# Patient Record
Sex: Female | Born: 1990 | Race: Black or African American | Hispanic: No | Marital: Single | Smoking: Never smoker
Health system: Southern US, Community
[De-identification: ages and names within clinical notes are randomized; demographics above are authoritative.]

## PROBLEM LIST (undated history)

## (undated) DIAGNOSIS — Z789 Other specified health status: Secondary | ICD-10-CM

## (undated) HISTORY — DX: Other specified health status: Z78.9

## (undated) HISTORY — PX: NO PAST SURGERIES: SHX2092

---

## 2019-05-11 ENCOUNTER — Other Ambulatory Visit: Payer: Self-pay

## 2019-05-11 ENCOUNTER — Encounter: Payer: Self-pay | Admitting: General Practice

## 2019-05-11 ENCOUNTER — Ambulatory Visit (INDEPENDENT_AMBULATORY_CARE_PROVIDER_SITE_OTHER): Payer: Medicaid Other | Admitting: *Deleted

## 2019-05-11 VITALS — BP 95/60 | HR 97 | Temp 97.9°F | Ht 61.0 in | Wt 89.0 lb

## 2019-05-11 DIAGNOSIS — Z3200 Encounter for pregnancy test, result unknown: Secondary | ICD-10-CM

## 2019-05-11 DIAGNOSIS — Z3201 Encounter for pregnancy test, result positive: Secondary | ICD-10-CM | POA: Diagnosis not present

## 2019-05-11 DIAGNOSIS — Z3480 Encounter for supervision of other normal pregnancy, unspecified trimester: Secondary | ICD-10-CM | POA: Insufficient documentation

## 2019-05-11 DIAGNOSIS — Z3481 Encounter for supervision of other normal pregnancy, first trimester: Secondary | ICD-10-CM

## 2019-05-11 LAB — POCT URINE PREGNANCY: Preg Test, Ur: POSITIVE — AB

## 2019-05-11 NOTE — Progress Notes (Signed)
   PRENATAL INTAKE SUMMARY  Ms. Drach presents today New OB Nurse Interview.  OB History    Gravida  5   Para  3   Term  3   Preterm      AB  1   Living  3     SAB  1   TAB      Ectopic      Multiple      Live Births  3          I have reviewed the patient's medical, obstetrical, social, and family histories, medications, and available lab results.  SUBJECTIVE She has no unusual complaints.  OBJECTIVE Initial nurse visit for history and lab work (New OB).  EDD: 11/23/2019 by LMP GA: [redacted]w[redacted]d G5P3013 [id number]  GENERAL APPEARANCE: alert, well appearing, in no apparent distress, oriented to person, place and time.   ASSESSMENT Normal pregnancy  PLAN Prenatal care-[facility] Pnl/HIV  OB Urine Culture GC/CT/PAP at next visit with Midwife HgbEval/SMA/CF ([facility])- patient declined today, will discuss at later date Panorama-declined today, will discuss at later date. Ultrasound ordered to confirm dating/viability Discussed enrollment for [employer]. Patient to purchase PNV  Clovis Pu, RN

## 2019-05-12 LAB — OBSTETRIC PANEL, INCLUDING HIV
Antibody Screen: NEGATIVE
Basophils Absolute: 0 10*3/uL (ref 0.0–0.2)
Basos: 1 %
EOS (ABSOLUTE): 0 10*3/uL (ref 0.0–0.4)
Eos: 1 %
HIV Screen 4th Generation wRfx: NONREACTIVE
Hematocrit: 41 % (ref 34.0–46.6)
Hemoglobin: 13.5 g/dL (ref 11.1–15.9)
Hepatitis B Surface Ag: NEGATIVE
Immature Grans (Abs): 0 10*3/uL (ref 0.0–0.1)
Immature Granulocytes: 0 %
Lymphocytes Absolute: 1.1 10*3/uL (ref 0.7–3.1)
Lymphs: 17 %
MCH: 26.5 pg — ABNORMAL LOW (ref 26.6–33.0)
MCHC: 32.9 g/dL (ref 31.5–35.7)
MCV: 80 fL (ref 79–97)
Monocytes Absolute: 0.4 10*3/uL (ref 0.1–0.9)
Monocytes: 5 %
Neutrophils Absolute: 5.2 10*3/uL (ref 1.4–7.0)
Neutrophils: 76 %
Platelets: 228 10*3/uL (ref 150–450)
RBC: 5.1 x10E6/uL (ref 3.77–5.28)
RDW: 15.3 % (ref 11.7–15.4)
RPR Ser Ql: NONREACTIVE
Rh Factor: POSITIVE
Rubella Antibodies, IGG: 4.3 index (ref >0.99)
WBC: 6.8 10*3/uL (ref 3.4–10.8)

## 2019-05-13 ENCOUNTER — Ambulatory Visit (HOSPITAL_COMMUNITY): Payer: Self-pay

## 2019-05-13 LAB — URINE CULTURE, OB REFLEX

## 2019-05-13 LAB — CULTURE, OB URINE

## 2019-05-26 ENCOUNTER — Other Ambulatory Visit: Payer: Self-pay

## 2019-05-26 ENCOUNTER — Other Ambulatory Visit: Payer: Self-pay | Admitting: Obstetrics and Gynecology

## 2019-05-26 ENCOUNTER — Ambulatory Visit (HOSPITAL_COMMUNITY)
Admission: RE | Admit: 2019-05-26 | Discharge: 2019-05-26 | Disposition: A | Discharge status: Home / Self Care | Payer: Medicaid Other | Source: Ambulatory Visit | Attending: Obstetrics and Gynecology | Admitting: Obstetrics and Gynecology

## 2019-05-26 DIAGNOSIS — Z3480 Encounter for supervision of other normal pregnancy, unspecified trimester: Secondary | ICD-10-CM | POA: Diagnosis present

## 2019-06-04 ENCOUNTER — Other Ambulatory Visit (HOSPITAL_COMMUNITY)
Admission: RE | Admit: 2019-06-04 | Discharge: 2019-06-04 | Disposition: A | Discharge status: Home / Self Care | Payer: Medicaid Other | Source: Ambulatory Visit | Attending: Obstetrics and Gynecology | Admitting: Obstetrics and Gynecology

## 2019-06-04 ENCOUNTER — Ambulatory Visit (INDEPENDENT_AMBULATORY_CARE_PROVIDER_SITE_OTHER): Payer: Medicaid Other | Admitting: Obstetrics and Gynecology

## 2019-06-04 ENCOUNTER — Other Ambulatory Visit: Payer: Self-pay

## 2019-06-04 VITALS — BP 102/70 | HR 101 | Temp 98.1°F | Wt 96.2 lb

## 2019-06-04 DIAGNOSIS — O98512 Other viral diseases complicating pregnancy, second trimester: Secondary | ICD-10-CM

## 2019-06-04 DIAGNOSIS — Z113 Encounter for screening for infections with a predominantly sexual mode of transmission: Secondary | ICD-10-CM | POA: Insufficient documentation

## 2019-06-04 DIAGNOSIS — Z3480 Encounter for supervision of other normal pregnancy, unspecified trimester: Secondary | ICD-10-CM | POA: Diagnosis present

## 2019-06-04 DIAGNOSIS — Z3A15 15 weeks gestation of pregnancy: Secondary | ICD-10-CM

## 2019-06-04 DIAGNOSIS — B009 Herpesviral infection, unspecified: Secondary | ICD-10-CM

## 2019-06-04 MED ORDER — VALACYCLOVIR HCL 500 MG PO TABS: 500.0000 mg | ORAL_TABLET | Freq: Two times a day (BID) | ORAL | 1 refills | Status: AC

## 2019-06-04 MED ORDER — BLOOD PRESSURE MONITOR AUTOMAT DEVI: 0 refills | Status: AC

## 2019-06-04 NOTE — Progress Notes (Signed)
  Subjective:    Alexandra Marshall is being seen today for her first obstetrical visit.  This is not a planned pregnancy. She is at [redacted]w[redacted]d gestation. 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 Last SVD was 06/19/2018. Relationship with FOB: significant other, living together. Patient does intend to breast feed. Pregnancy history fully reviewed.  Patient reports no complaints.  Review of Systems:   Review of Systems  Constitutional: Negative.   HENT: Negative.   Eyes: Negative.   Respiratory: Negative.   Cardiovascular: Negative.   Gastrointestinal: Negative.   Endocrine: Negative.   Genitourinary: Negative.   Musculoskeletal: Negative.   Skin: Negative.   Allergic/Immunologic: Negative.   Neurological: Negative.   Hematological: Negative.   Psychiatric/Behavioral: Negative.     Objective:     BP 102/70   Pulse (!) 101   Temp 98.1 F (36.7 C)   Wt 96 lb 3.2 oz (43.6 kg)   LMP 02/16/2019   BMI 18.18 kg/m  Physical Exam  Nursing note and vitals reviewed. Constitutional: She is oriented to person, place, and time. She appears well-developed and well-nourished.  HENT:  Head: Normocephalic and atraumatic.  Right Ear: External ear normal.  Left Ear: External ear normal.  Nose: Nose normal.  Mouth/Throat: Oropharynx is clear and moist.  Eyes: Pupils are equal, round, and reactive to light. Conjunctivae and EOM are normal.  Neck: Normal range of motion. Neck supple.  Cardiovascular: Normal rate, regular rhythm, normal heart sounds and intact distal pulses.  Respiratory: Effort normal and breath sounds normal.  GI: Soft. Bowel sounds are normal.  Genitourinary:    Vulva normal.     Genitourinary Comments: Uterus: gravid, S=D, SE: cervix is smooth, pink, no lesions, moderate amt of thick, white vaginal d/c -- WP, GC/CT done, closed/long/firm, no CMT or friability, no adnexal tenderness   Musculoskeletal: Normal range of motion.  Neurological: She  is alert and oriented to person, place, and time. She has normal reflexes.  Skin: Skin is warm and dry.  Psychiatric: She has a normal mood and affect. Her behavior is normal. Judgment and thought content normal.    Maternal Exam:  Abdomen: Patient reports no abdominal tenderness. Fundal height is 15 cm.    Introitus: Normal vulva. Normal vagina.  Ferning test: not done.  Nitrazine test: not done. Amniotic fluid character: not assessed.  Pelvis: adequate for delivery.   Cervix: Cervix evaluated by sterile speculum exam and digital exam.     Fetal Exam Fetal Monitor Review: Mode: hand-held doppler probe.   Baseline rate: 143 bpm.         Assessment:    Pregnancy: [id number] Patient Active Problem List   Diagnosis Date Noted  . Supervision of other normal pregnancy, antepartum 05/11/2019       Plan:     Initial labs results reviewed. Prenatal vitamins. Problem list reviewed and updated. AFP3 discussed: undecided. Role of ultrasound in pregnancy discussed; fetal survey: ordered. Amniocentesis discussed: not indicated. The nature of [facility] with multiple MDs and other Advanced Practice Providers was explained to patient; also emphasized that residents, students are part of our team.  Reviewed optimized OB schedule. BP cuff given  verbal & demonstrative instructions given  return demonstration given. Follow up in 5 weeks via My Chart video. 50% of 40 min visit spent on counseling and coordination of care.   Raelyn Mora, MSN, CNM 06/04/2019

## 2019-06-04 NOTE — Patient Instructions (Signed)

## 2019-06-05 ENCOUNTER — Encounter: Payer: Self-pay | Admitting: Obstetrics and Gynecology

## 2019-06-09 LAB — CERVICOVAGINAL ANCILLARY ONLY
Bacterial vaginitis: NEGATIVE
Candida vaginitis: NEGATIVE
Chlamydia: NEGATIVE
Neisseria Gonorrhea: NEGATIVE
Trichomonas: POSITIVE — AB

## 2019-06-14 ENCOUNTER — Telehealth: Payer: Self-pay | Admitting: General Practice

## 2019-06-14 ENCOUNTER — Telehealth: Payer: Self-pay | Admitting: *Deleted

## 2019-06-14 MED ORDER — METRONIDAZOLE 500 MG PO TABS: 2000.0000 mg | ORAL_TABLET | Freq: Once | ORAL | 1 refills | Status: AC

## 2019-06-14 NOTE — Telephone Encounter (Signed)
Attempted to call pt to inform her that her appointment that is scheduled on 07/07/2019 at 1:30pm will be an in office visit instead of [place] video per RN.  Phone not in service.  Mychart message sent to patient as well.

## 2019-06-14 NOTE — Telephone Encounter (Signed)
-----   Message from Laury Deep, [place] sent at 06/10/2019  5:02 PM EDT ----- Tx for trich

## 2019-06-14 NOTE — Telephone Encounter (Signed)
Spoke with patient regarding test results. Pt verified DOB. Patient positive for Trich. Medication sent to pharmacy. Advised patient she will need TOC in a month. Derl Barrow, RN

## 2019-06-29 ENCOUNTER — Other Ambulatory Visit: Payer: Self-pay

## 2019-06-29 ENCOUNTER — Other Ambulatory Visit (HOSPITAL_COMMUNITY): Payer: Self-pay | Admitting: *Deleted

## 2019-06-29 ENCOUNTER — Other Ambulatory Visit: Payer: Self-pay | Admitting: Obstetrics and Gynecology

## 2019-06-29 ENCOUNTER — Ambulatory Visit (HOSPITAL_COMMUNITY)
Admission: RE | Admit: 2019-06-29 | Discharge: 2019-06-29 | Disposition: A | Discharge status: Home / Self Care | Payer: Medicaid Other | Source: Ambulatory Visit | Attending: Obstetrics and Gynecology | Admitting: Obstetrics and Gynecology

## 2019-06-29 DIAGNOSIS — O350XX0 Maternal care for (suspected) central nervous system malformation in fetus, not applicable or unspecified: Secondary | ICD-10-CM

## 2019-06-29 DIAGNOSIS — Z363 Encounter for antenatal screening for malformations: Secondary | ICD-10-CM

## 2019-06-29 DIAGNOSIS — O359XX0 Maternal care for (suspected) fetal abnormality and damage, unspecified, not applicable or unspecified: Secondary | ICD-10-CM | POA: Diagnosis not present

## 2019-06-29 DIAGNOSIS — Z3A19 19 weeks gestation of pregnancy: Secondary | ICD-10-CM

## 2019-06-29 DIAGNOSIS — Z3480 Encounter for supervision of other normal pregnancy, unspecified trimester: Secondary | ICD-10-CM

## 2019-06-29 DIAGNOSIS — O3503X0 Maternal care for (suspected) central nervous system malformation or damage in fetus, choroid plexus cysts, not applicable or unspecified: Secondary | ICD-10-CM

## 2019-07-07 ENCOUNTER — Other Ambulatory Visit: Payer: Self-pay

## 2019-07-07 ENCOUNTER — Ambulatory Visit (INDEPENDENT_AMBULATORY_CARE_PROVIDER_SITE_OTHER): Payer: Medicaid Other | Admitting: Obstetrics and Gynecology

## 2019-07-07 ENCOUNTER — Other Ambulatory Visit (HOSPITAL_COMMUNITY)
Admission: RE | Admit: 2019-07-07 | Discharge: 2019-07-07 | Disposition: A | Discharge status: Home / Self Care | Payer: Medicaid Other | Source: Ambulatory Visit | Attending: Obstetrics and Gynecology | Admitting: Obstetrics and Gynecology

## 2019-07-07 ENCOUNTER — Encounter: Payer: Self-pay | Admitting: Obstetrics and Gynecology

## 2019-07-07 VITALS — BP 107/63 | HR 108 | Temp 98.3°F | Wt 101.2 lb

## 2019-07-07 DIAGNOSIS — Z113 Encounter for screening for infections with a predominantly sexual mode of transmission: Secondary | ICD-10-CM | POA: Insufficient documentation

## 2019-07-07 DIAGNOSIS — Z3480 Encounter for supervision of other normal pregnancy, unspecified trimester: Secondary | ICD-10-CM

## 2019-07-07 DIAGNOSIS — Z3A20 20 weeks gestation of pregnancy: Secondary | ICD-10-CM

## 2019-07-07 DIAGNOSIS — R3989 Other symptoms and signs involving the genitourinary system: Secondary | ICD-10-CM

## 2019-07-07 DIAGNOSIS — R8290 Unspecified abnormal findings in urine: Secondary | ICD-10-CM

## 2019-07-07 DIAGNOSIS — Z3482 Encounter for supervision of other normal pregnancy, second trimester: Secondary | ICD-10-CM

## 2019-07-07 LAB — POCT URINALYSIS DIPSTICK OB
Blood, UA: NEGATIVE
Glucose, UA: NEGATIVE
Leukocytes, UA: NEGATIVE
Nitrite, UA: NEGATIVE
Spec Grav, UA: 1.025 (ref 1.010–1.025)
Urobilinogen, UA: 1 E.U./dL
pH, UA: 6 (ref 5.0–8.0)

## 2019-07-07 NOTE — Patient Instructions (Signed)
Pregnancy and Sexually Transmitted Infections An STI (sexually transmitted infection) is a disease or infection that may be passed (transmitted) from person to person, usually during sexual activity. This may happen by way of saliva, semen, blood, vaginal mucus, or urine. An STI can be caused by bacteria, viruses, or parasites. During pregnancy, STIs can be dangerous for you and your unborn baby. It is important to take steps to reduce your chances of getting an STI. Also, you need to be seen by your health care provider right away if you think you may have an STI, or if you think you may have been exposed to an STI. Diagnosis and treatment will depend on the type of STI. If you are already pregnant, you will be screened for HIV (human immunodeficiency virus) early in your pregnancy. If you are at high risk for HIV, this test may be repeated during your third trimester of pregnancy. What are some common STIs? There are different types of STIs. Some STIs that cause problems in pregnancy include:  Gonorrhea.  Chlamydia.  Syphilis.  HIV and AIDS (acquired immunodeficiency syndrome).  Genital herpes.  Hepatitis.  Genital warts.  Human papillomavirus (HPV).  Trichomoniasis. STIs that do not affect the baby include:  Chancroid.  Pubic lice. What are the possible effects of STIs during pregnancy? STIs can cause:  Stillbirth.  Miscarriage.  Premature labor.  Premature rupture of the membranes.  Serious birth defects or deformities.  Infection of the amniotic sac.  Infections that occur after birth (postpartum) in you and the baby.  Slowed growth of the baby before birth.  Illnesses in newborns. What are common symptoms of STIs? Different STIs have different symptoms. Some women may not have any symptoms. If symptoms are present, they may include:  Painful or bloody urination.  Pain in the pelvis, abdomen, vagina, anus, throat, or eyes.  A skin rash, itching, or  irritation.  Growths, ulcerations, blisters, or sores in the genital and anal areas.  Fever.  Abnormal vaginal discharge, with or without bad odor.  Pain or bleeding during sexual intercourse.  Yellowing of the skin and the white parts of the eyes (jaundice).  Swollen glands in the groin area. Even if symptoms are not present, an STI can still be passed to another person during sexual contact. How are STIs diagnosed? Your health care provider can use tests to determine if you have an STI. These may include blood tests, urine tests, and tests performed during a pelvic exam. You should be screened for STIs, including gonorrhea and chlamydia, if:  You are sexually active and are younger than age 2.  You are age 26 or older and your health care provider tells you that you are at risk for these types of infections.  Your sexual activity has changed since the last time you were screened, and you are at an increased risk for chlamydia or gonorrhea. Ask your health care provider if you are at risk. How can I reduce my risk of getting an STI? Take these actions to reduce your risk of getting an STI:  Do not have any oral, vaginal, or anal sex. This is known as practicing abstinence.  If you have sex, use a latex condom or a female condom consistently and correctly during sexual intercourse.  Use dental dams and water-soluble lubricants during sexual activity. Do not use petroleum jelly or oils.  Avoid having multiple sexual partners.  Do not have sex with someone who has other sexual partners.  Do not  have sex with anyone you do not know or who is at high risk for an STI.  Avoid risky sex acts that can break the skin.  Do not have sex if you have open sores on your mouth or skin.  Avoid engaging in oral and anal sex acts.  Get the hepatitis vaccine. It is safe for pregnant women.  What should I do if I think I have an STI?  See your health care provider.  Tell your sexual  partner or partners. They should be tested and treated for any STIs.  Do not have sex until your health care provider says it is okay. Get help right away if: Contact your health care provider right away if:  You have any symptoms of an STI.  You think that you have, or your sexual partner has, an STI even if there are no symptoms.  You think that you may have been exposed to an STI. This information is not intended to replace advice given to you by your health care provider. Make sure you discuss any questions you have with your health care provider. Document Released: 01/23/2005 Document Revised: 04/09/2019 Document Reviewed: 07/22/2016 Elsevier Patient Education  2020 [employer]   Safe Sex Practicing safe sex means taking steps before and during sex to reduce your risk of:  Getting an STI (sexually transmitted infection).  Giving your partner an STI.  Unwanted or unplanned pregnancy. How can I practice safe sex?     Ways you can practice safe sex  Limit your sexual partners to only one partner who is having sex with only you.  Avoid using alcohol and drugs before having sex. Alcohol and drugs can affect your judgment.  Before having sex with a new partner: ? Talk to your partner about past partners, past STIs, and drug use. ? Get screened for STIs and discuss the results with your partner. Ask your partner to get screened, too.  Check your body regularly for sores, blisters, rashes, or unusual discharge. If you notice any of these problems, visit your health care provider.  Avoid sexual contact if you have symptoms of an infection or you are being treated for an STI.  While having sex, use a condom. Make sure to: ? Use a condom every time you have vaginal, oral, or anal sex. Both females and males should wear condoms during oral sex. ? Keep condoms in place from the beginning to the end of sexual activity. ? Use a latex condom, if possible. Latex condoms offer the  best protection. ? Use only water-based lubricants with a condom. Using petroleum-based lubricants or oils will weaken the condom and increase the chance that it will break. Ways your health care provider can help you practice safe sex  See your health care provider for regular screenings, exams, and tests for STIs.  Talk with your health care provider about what kind of birth control (contraception) is best for you.  Get vaccinated against hepatitis B and human papillomavirus (HPV).  If you are at risk of being infected with HIV (human immunodeficiency virus), talk with your health care provider about taking a prescription medicine to prevent HIV infection. You are at risk for HIV if you: ? Are a man who has sex with other men. ? Are sexually active with more than one partner. ? Take drugs by injection. ? Have a sex partner who has HIV. ? Have unprotected sex. ? Have sex with someone who has sex with both men and  women. ? Have had an STI. Follow these instructions at home:  Take over-the-counter and prescription medicines as told by your health care provider.  Keep all follow-up visits as told by your health care provider. This is important. Where to find more information  Centers for Disease Control and Prevention: PinkCheek.be  Planned Parenthood: https://www.plannedparenthood.org/  Office on Women's Health: BasketballVoice.it Summary  Practicing safe sex means taking steps before and during sex to reduce your risk of STIs, giving your partner STIs, and having an unwanted or unplanned pregnancy.  Before having sex with a new partner, talk to your partner about past partners, past STIs, and drug use.  Use a condom every time you have vaginal, oral, or anal sex. Both females and males should wear condoms during oral sex.  Check your body regularly for sores, blisters, rashes, or unusual  discharge. If you notice any of these problems, visit your health care provider.  See your health care provider for regular screenings, exams, and tests for STIs. This information is not intended to replace advice given to you by your health care provider. Make sure you discuss any questions you have with your health care provider. Document Released: 01/23/2005 Document Revised: 04/09/2019 Document Reviewed: 09/28/2018 Elsevier Patient Education  2020 [employer].

## 2019-07-07 NOTE — Progress Notes (Signed)
   PRENATAL VISIT NOTE  Subjective:  Alexandra Marshall is a 28 y.o. [phone] at [redacted]w[redacted]d being seen today for ongoing prenatal care.  She is currently monitored for the following issues for this low-risk pregnancy and has Supervision of other normal pregnancy, antepartum on their problem list.  Patient reports no complaints. She reports that she has "not been drinking enough water. She denies any UTI sx's. She states that she does not know if her partner took the Rx medication for trichomonas and she has had unprotected SI with him since she has been treated. Contractions: Not present. Vag. Bleeding: None.  Movement: Present. Denies leaking of fluid.   The following portions of the patient's history were reviewed and updated as appropriate: allergies, current medications, past family history, past medical history, past social history, past surgical history and problem list.   Objective:   Vitals:   07/07/19 1311  BP: 107/63  Marshall: (!) 108  Temp: 98.3 F (36.8 C)  Weight: 101 lb 3.2 oz (45.9 kg)    Fetal Status: Fetal Heart Rate (bpm): 147 Fundal Height: 19 cm Movement: Present     General:  Alert, oriented and cooperative. Patient is in no acute distress.  Skin: Skin is warm and dry. No rash noted.   Cardiovascular: Normal heart rate noted  Respiratory: Normal respiratory effort, no problems with respiration noted  Abdomen: Soft, gravid, appropriate for gestational age.  Pain/Pressure: Absent     Pelvic: Cervical exam deferred        Extremities: Normal range of motion.  Edema: None  Mental Status: Normal mood and affect. Normal behavior. Normal judgment and thought content.   Assessment and Plan:  Pregnancy: [id number] at [redacted]w[redacted]d 1. Screening examination for STD (sexually transmitted disease) - Urine cytology ancillary only([place]) - Advised to NOT have sex with partner until she is certain he has taken the medication or she will continue to re-infected further jeopardizing her  pregnancy - Explained the increased risk of PPROM, chorioamnionitis, PTL, PTB, and possibly NICU admission or fetal death.  2. Supervision of other normal pregnancy, antepartum  3. Dark yellow-colored urine - Culture, OB Urine  will tx with (+) results - POC Urinalysis Dipstick OB  4. Abnormal urine odor - Culture, OB Urine - POC Urinalysis Dipstick OB  Preterm labor symptoms and general obstetric precautions including but not limited to vaginal bleeding, contractions, leaking of fluid and fetal movement were reviewed in detail with the patient. Please refer to After Visit Summary for other counseling recommendations.   Return in about 4 weeks (around 08/04/2019) for Return OB - My Chart video.  Future Appointments  Date Time Provider [place]  08/05/2019  2:10 PM Laury Deep, CNM [facility] None  08/24/2019  1:15 PM [place] 4 WH-MFCUS M[facility]-US  09/02/2019  8:30 AM Laury Deep, CNM [facility] None    Laury Deep, CNM

## 2019-07-09 LAB — CULTURE, OB URINE

## 2019-07-09 LAB — URINE CULTURE, OB REFLEX: Organism ID, Bacteria: NO GROWTH

## 2019-07-13 LAB — URINE CYTOLOGY ANCILLARY ONLY
Chlamydia: NEGATIVE
Neisseria Gonorrhea: POSITIVE — AB
Trichomonas: NEGATIVE

## 2019-07-14 ENCOUNTER — Telehealth: Payer: Self-pay | Admitting: *Deleted

## 2019-07-14 NOTE — Telephone Encounter (Signed)
-----   Message from Laury Deep, [place] sent at 07/14/2019  9:42 AM EDT ----- Please notify and treat for (+) GC

## 2019-07-14 NOTE — Telephone Encounter (Signed)
Left voice message for patient to return nurse call regarding test results.  Edwardo Wojnarowski L, RN  

## 2019-07-15 LAB — URINE CYTOLOGY ANCILLARY ONLY
Bacterial vaginitis: NEGATIVE
Candida vaginitis: NEGATIVE

## 2019-07-15 NOTE — Telephone Encounter (Signed)
Left voice message for patient to call nurse back regarding lab results.  Derl Barrow, RN

## 2019-07-20 NOTE — Telephone Encounter (Signed)
Patient called regarding test results. Pt verified DOB. Patient aware of + gonorrhea culture and need for treatment. Appointment for Monday 07/26/2019 for treatment.  Derl Barrow, RN

## 2019-07-26 ENCOUNTER — Ambulatory Visit: Payer: Medicaid Other

## 2019-08-05 ENCOUNTER — Other Ambulatory Visit: Payer: Self-pay

## 2019-08-05 ENCOUNTER — Ambulatory Visit (INDEPENDENT_AMBULATORY_CARE_PROVIDER_SITE_OTHER): Payer: Medicaid Other | Admitting: Obstetrics and Gynecology

## 2019-08-05 VITALS — BP 103/65 | HR 86 | Temp 98.3°F | Wt 106.8 lb

## 2019-08-05 DIAGNOSIS — A6009 Herpesviral infection of other urogenital tract: Secondary | ICD-10-CM

## 2019-08-05 DIAGNOSIS — O98212 Gonorrhea complicating pregnancy, second trimester: Secondary | ICD-10-CM

## 2019-08-05 DIAGNOSIS — G930 Cerebral cysts: Secondary | ICD-10-CM

## 2019-08-05 DIAGNOSIS — Z3A24 24 weeks gestation of pregnancy: Secondary | ICD-10-CM

## 2019-08-05 DIAGNOSIS — R636 Underweight: Secondary | ICD-10-CM

## 2019-08-05 DIAGNOSIS — O98312 Other infections with a predominantly sexual mode of transmission complicating pregnancy, second trimester: Secondary | ICD-10-CM

## 2019-08-05 DIAGNOSIS — Z3480 Encounter for supervision of other normal pregnancy, unspecified trimester: Secondary | ICD-10-CM

## 2019-08-05 DIAGNOSIS — O23592 Infection of other part of genital tract in pregnancy, second trimester: Secondary | ICD-10-CM

## 2019-08-05 DIAGNOSIS — O98219 Gonorrhea complicating pregnancy, unspecified trimester: Secondary | ICD-10-CM

## 2019-08-05 DIAGNOSIS — A5901 Trichomonal vulvovaginitis: Secondary | ICD-10-CM

## 2019-08-05 DIAGNOSIS — Z7251 High risk heterosexual behavior: Secondary | ICD-10-CM | POA: Insufficient documentation

## 2019-08-05 DIAGNOSIS — O23599 Infection of other part of genital tract in pregnancy, unspecified trimester: Secondary | ICD-10-CM | POA: Insufficient documentation

## 2019-08-05 MED ORDER — AZITHROMYCIN 500 MG PO TABS
1000.0000 mg | ORAL_TABLET | Freq: Once | ORAL | Status: AC
  Administered 2019-08-05: 1000 mg via ORAL

## 2019-08-05 MED ORDER — CEFTRIAXONE SODIUM 250 MG IJ SOLR
250.0000 mg | Freq: Once | INTRAMUSCULAR | Status: AC
  Administered 2019-08-05: 250 mg via INTRAMUSCULAR

## 2019-08-05 NOTE — Progress Notes (Signed)
   PRENATAL VISIT NOTE  Subjective:  Alexandra Marshall is a 28 y.o. [phone] at [redacted]w[redacted]d being seen today for ongoing prenatal care.  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  Patient reports no complaints.  Contractions: Not present. Vag. Bleeding: None.  Movement: Present. Denies leaking of fluid.   The following portions of the patient's history were reviewed and updated as appropriate: allergies, current medications, past family history, past medical history, past social history, past surgical history and problem list.   Objective:   Vitals:   08/05/19 1352  BP: 103/65  Pulse: 86  Temp: 98.3 F (36.8 C)  Weight: 106 lb 12.8 oz (48.4 kg)    Fetal Status:   Fundal Height: 25 cm Movement: Present     General:  Alert, oriented and cooperative. Patient is in no acute distress.  Skin: Skin is warm and dry. No rash noted.   Cardiovascular: Normal heart rate noted  Respiratory: Normal respiratory effort, no problems with respiration noted  Abdomen: Soft, gravid, appropriate for gestational age.  Pain/Pressure: Absent     Pelvic: Cervical exam deferred        Extremities: Normal range of motion.  Edema: None  Mental Status: Normal mood and affect. Normal behavior. Normal judgment and thought content.   Assessment and Plan:  Pregnancy: G5P3013 at [redacted]w[redacted]d  1. Supervision of other normal pregnancy, antepartum  Doing well, missed several appointments. Discussed importance of keeping appointments   2. Gonorrhea affecting pregnancy, antepartum  - cefTRIAXone (ROCEPHIN) injection 250 mg - azithromycin (ZITHROMAX) tablet 1,000 mg  3. Gonorrhea affecting pregnancy in second trimester   4. Trichomonal vaginitis during pregnancy in second trimester  TOC negative   5. Patient underweight  Has gained 16 lbs with this pregnant.   6. High risk sexual behavior, unspecified type  Had in depth conversation about high risk sexual activity, risk for HIV etc.   Partner needs full STI workup at the [facility]. Caution advised for unprotected sex with this partner.   7. Genital herpes affecting pregnancy in second trimester  Has RX for valtrex. Start at 36 weeks   8. Choroid plexus cyst  She has a f/u US on 8/25  Preterm labor symptoms and general obstetric precautions including but not limited to vaginal bleeding, contractions, leaking of fluid and fetal movement were reviewed in detail with the patient. Please refer to After Visit Summary for other counseling recommendations.   No follow-ups on file.  Future Appointments  Date Time Provider [place]  08/24/2019  1:15 PM [place] 4 WH-[facility]US [facility]-US  09/02/2019  8:30 AM Laury Deep, CNM [facility] None    Noni Saupe, NP

## 2019-08-06 ENCOUNTER — Encounter: Payer: Self-pay | Admitting: General Practice

## 2019-08-24 ENCOUNTER — Ambulatory Visit (HOSPITAL_COMMUNITY): Payer: Medicaid Other

## 2019-08-26 ENCOUNTER — Other Ambulatory Visit: Payer: Self-pay

## 2019-08-26 ENCOUNTER — Ambulatory Visit (HOSPITAL_COMMUNITY)
Admission: RE | Admit: 2019-08-26 | Discharge: 2019-08-26 | Disposition: A | Discharge status: Home / Self Care | Payer: Medicaid Other | Source: Ambulatory Visit | Attending: Obstetrics and Gynecology | Admitting: Obstetrics and Gynecology

## 2019-08-26 ENCOUNTER — Encounter (HOSPITAL_COMMUNITY): Payer: Self-pay

## 2019-08-26 DIAGNOSIS — O350XX0 Maternal care for (suspected) central nervous system malformation in fetus, not applicable or unspecified: Secondary | ICD-10-CM | POA: Diagnosis not present

## 2019-08-26 DIAGNOSIS — O3503X0 Maternal care for (suspected) central nervous system malformation or damage in fetus, choroid plexus cysts, not applicable or unspecified: Secondary | ICD-10-CM

## 2019-08-26 DIAGNOSIS — Z3A27 27 weeks gestation of pregnancy: Secondary | ICD-10-CM | POA: Diagnosis not present

## 2019-08-26 DIAGNOSIS — Z362 Encounter for other antenatal screening follow-up: Secondary | ICD-10-CM

## 2019-08-26 DIAGNOSIS — O359XX0 Maternal care for (suspected) fetal abnormality and damage, unspecified, not applicable or unspecified: Secondary | ICD-10-CM

## 2019-09-02 ENCOUNTER — Encounter: Payer: Medicaid Other | Admitting: Obstetrics and Gynecology

## 2019-09-03 ENCOUNTER — Ambulatory Visit: Payer: Medicaid Other | Admitting: *Deleted

## 2019-09-03 ENCOUNTER — Other Ambulatory Visit: Payer: Self-pay

## 2019-09-03 DIAGNOSIS — Z3480 Encounter for supervision of other normal pregnancy, unspecified trimester: Secondary | ICD-10-CM

## 2019-09-03 NOTE — Progress Notes (Signed)
   Patient clinic for 28 week lab work.  Derl Barrow, RN

## 2019-09-04 ENCOUNTER — Other Ambulatory Visit: Payer: Self-pay | Admitting: Obstetrics and Gynecology

## 2019-09-04 DIAGNOSIS — O99013 Anemia complicating pregnancy, third trimester: Secondary | ICD-10-CM

## 2019-09-04 LAB — GLUCOSE TOLERANCE, 2 HOURS W/ 1HR
Glucose, 1 hour: 90 mg/dL (ref 65–179)
Glucose, 2 hour: 96 mg/dL (ref 65–152)
Glucose, Fasting: 69 mg/dL (ref 65–91)

## 2019-09-04 LAB — CBC
Hematocrit: 32.2 % — ABNORMAL LOW (ref 34.0–46.6)
Hemoglobin: 9.8 g/dL — ABNORMAL LOW (ref 11.1–15.9)
MCH: 22.4 pg — ABNORMAL LOW (ref 26.6–33.0)
MCHC: 30.4 g/dL — ABNORMAL LOW (ref 31.5–35.7)
MCV: 74 fL — ABNORMAL LOW (ref 79–97)
Platelets: 252 10*3/uL (ref 150–450)
RBC: 4.37 x10E6/uL (ref 3.77–5.28)
RDW: 13.9 % (ref 11.7–15.4)
WBC: 8.9 10*3/uL (ref 3.4–10.8)

## 2019-09-04 LAB — HIV ANTIBODY (ROUTINE TESTING W REFLEX): HIV Screen 4th Generation wRfx: NONREACTIVE

## 2019-09-04 LAB — RPR: RPR Ser Ql: NONREACTIVE

## 2019-09-04 MED ORDER — FERROUS SULFATE 325 (65 FE) MG PO TBEC: 325.0000 mg | DELAYED_RELEASE_TABLET | Freq: Two times a day (BID) | ORAL | 2 refills | Status: DC

## 2019-09-09 ENCOUNTER — Encounter: Payer: Medicaid Other | Admitting: Obstetrics and Gynecology

## 2019-09-16 ENCOUNTER — Other Ambulatory Visit (HOSPITAL_COMMUNITY)
Admission: RE | Admit: 2019-09-16 | Discharge: 2019-09-16 | Disposition: A | Discharge status: Home / Self Care | Payer: Medicaid Other | Source: Ambulatory Visit | Attending: Obstetrics and Gynecology | Admitting: Obstetrics and Gynecology

## 2019-09-16 ENCOUNTER — Ambulatory Visit (INDEPENDENT_AMBULATORY_CARE_PROVIDER_SITE_OTHER): Payer: Medicaid Other | Admitting: Obstetrics and Gynecology

## 2019-09-16 ENCOUNTER — Other Ambulatory Visit: Payer: Self-pay

## 2019-09-16 ENCOUNTER — Other Ambulatory Visit: Payer: Self-pay | Admitting: Obstetrics and Gynecology

## 2019-09-16 VITALS — BP 108/67 | HR 89 | Temp 98.1°F | Wt 113.0 lb

## 2019-09-16 DIAGNOSIS — Z3480 Encounter for supervision of other normal pregnancy, unspecified trimester: Secondary | ICD-10-CM

## 2019-09-16 DIAGNOSIS — Z113 Encounter for screening for infections with a predominantly sexual mode of transmission: Secondary | ICD-10-CM | POA: Diagnosis present

## 2019-09-16 DIAGNOSIS — O99013 Anemia complicating pregnancy, third trimester: Secondary | ICD-10-CM

## 2019-09-16 DIAGNOSIS — Z3A30 30 weeks gestation of pregnancy: Secondary | ICD-10-CM

## 2019-09-16 MED ORDER — FERROUS SULFATE 325 (65 FE) MG PO TBEC: 325.0000 mg | DELAYED_RELEASE_TABLET | Freq: Two times a day (BID) | ORAL | 2 refills | Status: AC

## 2019-09-16 NOTE — Progress Notes (Addendum)
   PRENATAL VISIT NOTE  Subjective:  Alexandra Marshall is a 28 y.o. [phone] at [redacted]w[redacted]d being seen today for ongoing prenatal care.  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  Patient reports her partner got treated for trichomonas, but not gonorrhea. She reports having SI with him "a couple of times, but not everyday".  Contractions: Not present. Vag. Bleeding: None.  Movement: Present. Denies leaking of fluid.   The following portions of the patient's history were reviewed and updated as appropriate: allergies, current medications, past family history, past medical history, past social history, past surgical history and problem list.   Objective:   Vitals:   09/16/19 1451  BP: 108/67  Pulse: 89  Temp: 98.1 F (36.7 C)  Weight: 113 lb (51.3 kg)    Fetal Status: Fetal Heart Rate (bpm): 146 Fundal Height: 28 cm Movement: Present  Presentation: Undeterminable  General:  Alert, oriented and cooperative. Patient is in no acute distress.  Skin: Skin is warm and dry. No rash noted.   Cardiovascular: Normal heart rate noted  Respiratory: Normal respiratory effort, no problems with respiration noted  Abdomen: Soft, gravid, appropriate for gestational age.  Pain/Pressure: Absent     Pelvic: Cervical exam performed Dilation: Closed Effacement (%): Thick Station: Ballotable  Extremities: Normal range of motion.  Edema: None  Mental Status: Normal mood and affect. Normal behavior. Normal judgment and thought content.   Assessment and Plan:  Pregnancy: [id number] at [redacted]w[redacted]d 1. Supervision of other normal pregnancy, antepartum - Anticipatory guidance for next visit to be via [facility] and GBS with  cervical exam @ 36 wks - Plan to discuss labor plans at nv - Declined TdaP  2. Screening examination for STD (sexually transmitted disease) - Stressed the importance of abstinence, if partner is not going to be treated - Discussed that STIs can cause PTl  - TOC today  will have patient come in for tx and write Rx for partner, if (+) results - Cervicovaginal ancillary only( [place]) - Information provided on GT in pregnancy and expedited partner tx   3. Anemia affecting pregnancy in third trimester - Resend Rx for ferrous sulfate 325 (65 FE) MG EC tablet; Take 1 tablet (325 mg total) by mouth 2 (two) times daily with a meal.  Dispense: 60 tablet; Refill: 2  Preterm labor symptoms and general obstetric precautions including but not limited to vaginal bleeding, contractions, leaking of fluid and fetal movement were reviewed in detail with the patient. Please refer to After Visit Summary for other counseling recommendations.   Return in about 2 weeks (around 09/30/2019) for Return OB - My Chart video.  Future Appointments  Date Time Provider [place]  09/30/2019  9:50 AM Laury Deep, CNM [facility] None  10/28/2019  1:30 PM Laury Deep, [place] None    Laury Deep, CNM

## 2019-09-16 NOTE — Patient Instructions (Addendum)
Gonorrhea Gonorrhea is a sexually transmitted disease (STD) that can affect both men and women. If left untreated, this infection can:  Damage the female or female organs.  Cause women and men to be unable to have children (be sterile).  Harm a fetus if an infected woman is pregnant. It is important to get treatment for gonorrhea as soon as possible. It is also necessary for all of your sexual partners to be tested for the infection. What are the causes? This condition is caused by bacteria called Neisseria gonorrhoeae. The infection is spread from person to person through sexual contact, including oral, anal, and vaginal sex. A newborn can contract the infection from his or her mother during birth. What increases the risk? The following factors may make you more likely to develop this condition:  Being a woman who is younger than 26 years of age and who is sexually active.  Being a woman 42 years of age or older who has: ? A new sex partner. ? More than one sex partner. ? A sex partner who has an STD.  Being a man who has: ? A new sex partner. ? More than one sex partner. ? A sex partner who has an STD.  Using condoms inconsistently.  Currently having, or having previously had, an STD.  Exchanging sex for money or drugs. What are the signs or symptoms? Some people do not have any symptoms. If you do have symptoms, they may be different for females and males. For females  Pain in the lower abdomen.  Abnormal vaginal discharge. The discharge may be cloudy, thick, or yellow-green in color.  Bleeding between periods.  Painful sex.  Burning or itching in and around the vagina.  Pain or burning when urinating.  Irritation, pain, bleeding, or discharge from the rectum. This may occur if the infection was spread by anal sex.  Sore throat or swollen lymph nodes in the neck. This may occur if the infection was spread by oral sex. For males  Abnormal discharge from the penis.  This discharge may be cloudy, thick, or yellow-green in color.  Pain or burning during urination.  Pain or swelling in the testicles.  Irritation, pain, bleeding, or discharge from the rectum. This may occur if the infection was spread by anal sex.  Sore throat, fever, or swollen lymph nodes in the neck. This may occur if the infection was spread by oral sex. How is this diagnosed? This condition is diagnosed based on:  A physical exam.  A sample of discharge that is examined under a microscope to look for the bacteria. The discharge may be taken from the urethra, cervix, throat, or rectum.  Urine tests. Not all of test results will be available during your visit. How is this treated? This condition is treated with antibiotic medicines. It is important for treatment to begin as soon as possible. Early treatment may prevent some problems from developing. Do not have sex during treatment. Avoid all types of sexual activity for 7 days after treatment is complete and until any sex partners have been treated. Follow these instructions at home:  Take over-the-counter and prescription medicines only as told by your health care provider.  Take your antibiotic medicine as told by your health care provider. Do not stop taking the antibiotic even if you start to feel better.  Do not have sex until at least 7 days after you and your partner(s) have finished treatment and your health care provider says it is okay.  It is your responsibility to get your test results. Ask your health care provider, or the department performing the test, when your results will be ready.  If you test positive for gonorrhea, inform your recent sexual partners. This includes any oral, anal, or vaginal sex partners. They need to be checked for gonorrhea even if they do not have symptoms. They may need treatment, even if they test negative for gonorrhea.  Keep all follow-up visits as told by your health care provider.  This is important. How is this prevented?   Use latex condoms correctly every time you have sexual intercourse.  Ask if your sexual partner has been tested for STDs and had negative results.  Avoid having multiple sexual partners. Contact a health care provider if:  You develop a bad reaction to the medicine you were prescribed. This may include: ? A rash. ? Nausea. ? Vomiting. ? Diarrhea.  Your symptoms do not get better after a few days of taking antibiotics.  Your symptoms get worse.  You develop new symptoms.  Your pain gets worse.  You have a fever.  You develop pain, itching, or discharge around the eyes. Get help right away if:  You feel dizzy or faint.  You have trouble breathing or have shortness of breath.  You develop an irregular heartbeat.  You have severe abdominal pain with or without shoulder pain.  You develop any bumps or sores (lesions) on your skin.  You develop warmth, redness, pain, or swelling around your joints, such as the knee. Summary  Gonorrhea is an STD that can affect both men and women.  This condition is caused by bacteria called Neisseria gonorrhoeae. The infection is spread from person to person, usually through sexual contact, including oral, anal, and vaginal sex.  Symptoms vary between males and females. Generally, they include abnormal discharge and burning during urination. Women may also experience painful sex, itching around the vagina, and bleeding between menstrual periods. Men may also experience swelling of the testicles.  This condition is treated with antibiotic medicines. Do not have sex until at least 7 days after completing antibiotic treatment.  If left untreated, gonorrhea can have serious side effects and complications. This information is not intended to replace advice given to you by your health care provider. Make sure you discuss any questions you have with your health care provider. Document Released:  12/13/2000 Document Revised: 01/22/2019 Document Reviewed: 11/15/2016 Elsevier Patient Education  2020 [employer].   Expedited Partner Therapy:  Information Sheet for Patients and Partners               You have been offered expedited partner therapy (EPT). This information sheet contains important information and warnings you need to be aware of, so please read it carefully.   Expedited Partner Therapy (EPT) is the clinical practice of treating the sexual partners of persons who receive chlamydia, gonorrhea, or trichomoniasis diagnoses by providing medications or prescriptions to the patient. Patients then provide partners with these therapies without the health-care provider having examined the partner. In other words, EPT is a convenient, fast and private way for patients to help their sexual partners get treated.   Chlamydia and gonorrhea are bacterial infections you get from having sex with a person who is already infected. Trichomoniasis (or trich) is a very common sexually transmitted infection (STI) that is caused by infection with a protozoan parasite called Trichomonas vaginalis.  Many people with these infections dont know it because they feel fine, but without  treatment these infections can cause serious health problems, such as pelvic inflammatory disease, ectopic pregnancy, infertility and increased risk of HIV.   It is important to get treated as soon as possible to protect your health, to avoid spreading these infections to others, and to prevent yourself from becoming re-infected. The good news is these infections can be easily cured with proper antibiotic medicine. The best way to take care of your self is to see a doctor or go to your local health department. If you are not able to see a doctor or other medical provider, you should take EPT.    Recommended Medication: EPT for Chlamydia:  Azithromycin (Zithromax) 1 gram orally in a single dose EPT for Gonorrhea:  Cefixime  (Suprax) 400 milligrams orally in a single dose PLUS azithromycin (Zithromax) 1 gram orally in a single dose EPT for Trichomoniasis:  Metronidazole (Flagyl) 2 grams orally in a single dose   These medicines are very safe. However, you should not take them if you have ever had an allergic reaction (like a rash) to any of these medicines: azithromycin (Zithromax), erythromycin, clarithromycin (Biaxin), metronidazole (Flagyl), tinidazole (Tindimax). If you are uncertain about whether you have an allergy, call your medical provider or pharmacist before taking this medicine. If you have a serious, long-term illness like kidney, liver or heart disease, colitis or stomach problems, or you are currently taking other prescription medication, talk to your provider before taking this medication.   Women: If you have lower belly pain, pain during sex, vomiting, or a fever, do not take this medicine. Instead, you should see a medical provider to be certain you do not have pelvic inflammatory disease (PID). PID can be serious and lead to infertility, pregnancy problems or chronic pelvic pain.   Pregnant Women: It is very important for you to see a doctor to get pregnancy services and pre-natal care. These antibiotics for EPT are safe for pregnant women, but you still need to see a medical provider as soon as possible. It is also important to note that Doxycycline is an alternative therapy for chlamydia, but it should not be taken by someone who is pregnant.   Men: If you have pain or swelling in the testicles or a fever, do not take this medicine and see a medical provider.     Men who have sex with men (MSM): MSM in [place] continue to experience high rates of syphilis and HIV. Many MSM with gonorrhea or chlamydia could also have syphilis and/or HIV and not know it. If you are a man who has sex with other men, it is very important that you see a medical provider and are tested for HIV and syphilis. EPT is not  recommended for gonorrhea for MSM.  Recommended treatment for gonorrhea for MSM is Rocephin (shot) AND azithromycin due to decreased cure rate.  Please see your medical provider if this is the case.    Along with this information sheet is a prescription for the medicine. If you receive a prescription it will be in your name and will indicate your date of birth, or it will be in the name of Expedited Partner Therapy.   In either case, you can have the prescription filled at a pharmacy. You will be responsible for the cost of the medicine, unless you have prescription drug coverage. In that case, you could provide your name so the pharmacy could bill your health plan.   Take the medication as directed. Some people will have  a mild, upset stomach, which does not last long. AVOID alcohol 24 hours after taking metronidazole (Flagyl) to reduce the possibility of a disulfiram-like reaction (severe vomiting and abdominal pain).  After taking the medicine, do not have sex for 7 days. Do not share this medicine or give it to anyone else. It is important to tell everyone you have had sex with in the last 60 days that they need to go and get tested for sexually transmitted infections.   Ways to prevent these and other sexually transmitted infections (STIs):    Abstain from sex. This is the only sure way to avoid getting an STI.   Use barrier methods, such as condoms, consistently and correctly.   Limit the number of sexual partners.   Have regular physical exams, including testing for STIs.   For more information about EPT or other issues pertaining to an STI, please contact your medical provider or the [facility] at [phone] or [email or web address]

## 2019-09-20 ENCOUNTER — Telehealth: Payer: Self-pay | Admitting: *Deleted

## 2019-09-20 DIAGNOSIS — B9689 Other specified bacterial agents as the cause of diseases classified elsewhere: Secondary | ICD-10-CM

## 2019-09-20 DIAGNOSIS — B3731 Acute candidiasis of vulva and vagina: Secondary | ICD-10-CM

## 2019-09-20 DIAGNOSIS — B373 Candidiasis of vulva and vagina: Secondary | ICD-10-CM

## 2019-09-20 LAB — CERVICOVAGINAL ANCILLARY ONLY
Bacterial Vaginitis (gardnerella): POSITIVE — AB
Candida Glabrata: NEGATIVE
Candida Vaginitis: POSITIVE — AB
Molecular Disclaimer: NEGATIVE
Molecular Disclaimer: NEGATIVE
Molecular Disclaimer: NEGATIVE
Molecular Disclaimer: NORMAL
Trichomonas: NEGATIVE

## 2019-09-20 MED ORDER — TERCONAZOLE 0.4 % VA CREA: 1.0000 | TOPICAL_CREAM | Freq: Every day | VAGINAL | 0 refills | Status: DC

## 2019-09-20 MED ORDER — METRONIDAZOLE 500 MG PO TABS: 500.0000 mg | ORAL_TABLET | Freq: Two times a day (BID) | ORAL | 0 refills | Status: DC

## 2019-09-20 NOTE — Telephone Encounter (Signed)
-----   Message from Laury Deep, [place] sent at 09/20/2019  3:42 PM EDT ----- Treat for BV and yeast

## 2019-09-21 LAB — CERVICOVAGINAL ANCILLARY ONLY
Chlamydia: NEGATIVE
Neisseria Gonorrhea: NEGATIVE

## 2019-09-30 ENCOUNTER — Telehealth (INDEPENDENT_AMBULATORY_CARE_PROVIDER_SITE_OTHER): Payer: Medicaid Other | Admitting: Obstetrics and Gynecology

## 2019-09-30 ENCOUNTER — Encounter: Payer: Self-pay | Admitting: Obstetrics and Gynecology

## 2019-09-30 VITALS — BP 95/66 | HR 85 | Wt 113.8 lb

## 2019-09-30 DIAGNOSIS — O98313 Other infections with a predominantly sexual mode of transmission complicating pregnancy, third trimester: Secondary | ICD-10-CM

## 2019-09-30 DIAGNOSIS — Z3480 Encounter for supervision of other normal pregnancy, unspecified trimester: Secondary | ICD-10-CM

## 2019-09-30 DIAGNOSIS — R636 Underweight: Secondary | ICD-10-CM

## 2019-09-30 DIAGNOSIS — O99013 Anemia complicating pregnancy, third trimester: Secondary | ICD-10-CM

## 2019-09-30 DIAGNOSIS — Z3A32 32 weeks gestation of pregnancy: Secondary | ICD-10-CM

## 2019-09-30 DIAGNOSIS — A6009 Herpesviral infection of other urogenital tract: Secondary | ICD-10-CM

## 2019-09-30 MED ORDER — NIFEREX PO TABS: 2.0000 | ORAL_TABLET | Freq: Two times a day (BID) | ORAL | 3 refills | Status: AC

## 2019-09-30 NOTE — Progress Notes (Addendum)
   MY CHART VIDEO VIRTUAL OBSTETRICS VISIT ENCOUNTER NOTE  I connected with Alexandra Marshall on 09/30/19 at  9:50 AM EDT by My Chart video at home and verified that I am speaking with the correct person using two identifiers.   I discussed the limitations, risks, security and privacy concerns of performing an evaluation and management service by My Chart video and the availability of in person appointments. I also discussed with the patient that there may be a patient responsible charge related to this service. The patient expressed understanding and agreed to proceed.  Subjective:  Alexandra Marshall is a 28 y.o. [phone] at [redacted]w[redacted]d being followed for ongoing prenatal care.  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  Patient reports no complaints. Reports fetal movement. Denies any contractions, bleeding or leaking of fluid.   The following portions of the patient's history were reviewed and updated as appropriate: allergies, current medications, past family history, past medical history, past social history, past surgical history and problem list.   Objective:   General:  Alert, oriented and cooperative.   Mental Status: Normal mood and affect perceived. Normal judgment and thought content.  Rest of physical exam deferred due to type of encounter  BP 95/66   Pulse 85   Wt 113 lb 12.8 oz (51.6 kg)   LMP 02/16/2019   BMI 21.50 kg/m  **Done by patient's own at home BP cuff and scale  Assessment and Plan:  Pregnancy: [id number] at [redacted]w[redacted]d  1. Patient underweight - Appropriate weight gain during pregnancy  2. Genital herpes affecting pregnancy in second trimester - Rx'd Valtrex earlier in  pregnancy for outbreak - Will advise to restart Valtrex suppression at 36 wks  3. Supervision of other normal pregnancy, antepartum - Anticipatory guidance for GBS & cx check nv  4. Anemia affecting pregnancy in third trimester - Not taking FeSO4 d/t pharmacy requiring her to pay out-of-pocket for Rx that was sent as it is an OTC medication - Will change Rx to something else that may not be OTC  Preterm labor symptoms and general obstetric precautions including but not limited to vaginal bleeding, contractions, leaking of fluid and fetal movement were reviewed in detail with the patient.  I discussed the assessment and treatment plan with the patient. The patient was provided an opportunity to ask questions and all were answered. The patient agreed with the plan and demonstrated an understanding of the instructions. The patient was advised to call back or seek an in-person office evaluation/go to [facility] at [facility] for any urgent or concerning symptoms. Please refer to After Visit Summary for other counseling recommendations.   I provided 5 minutes of non-face-to-face time during this encounter. There was 5 minutes of chart review time spent prior to this encounter. Total time spent = 10 minutes.  Return in about 4 weeks (around 10/28/2019) for Return OB w/GBS.  Future Appointments  Date Time Provider [place]  10/28/2019  1:30 PM Laury Deep, CNM [facility] None    Laury Deep, [place] for [employer], [place]

## 2019-09-30 NOTE — Addendum Note (Signed)
Addended by: Graceann Congress on: 09/30/2019 10:11 AM   Modules accepted: Orders

## 2019-10-04 ENCOUNTER — Telehealth: Payer: Self-pay | Admitting: *Deleted

## 2019-10-04 NOTE — Telephone Encounter (Signed)
Patient called stating she is living in [facility] with her mom. She is requesting her records be faxed to previous OB provider. Advise patient to either go to that office or come to [facility] and sign ROI to have records released. Fax number given.   Derl Barrow, RN

## 2019-10-28 ENCOUNTER — Encounter: Payer: Medicaid Other | Admitting: Obstetrics and Gynecology

## 2019-11-03 ENCOUNTER — Other Ambulatory Visit: Payer: Self-pay

## 2019-11-03 ENCOUNTER — Ambulatory Visit (INDEPENDENT_AMBULATORY_CARE_PROVIDER_SITE_OTHER): Payer: Medicaid Other

## 2019-11-03 ENCOUNTER — Other Ambulatory Visit (HOSPITAL_COMMUNITY)
Admission: RE | Admit: 2019-11-03 | Discharge: 2019-11-03 | Disposition: A | Discharge status: Home / Self Care | Payer: Medicaid Other | Source: Ambulatory Visit

## 2019-11-03 VITALS — BP 116/73 | HR 102 | Temp 98.5°F | Wt 122.4 lb

## 2019-11-03 DIAGNOSIS — Z3480 Encounter for supervision of other normal pregnancy, unspecified trimester: Secondary | ICD-10-CM | POA: Insufficient documentation

## 2019-11-03 DIAGNOSIS — Z3483 Encounter for supervision of other normal pregnancy, third trimester: Secondary | ICD-10-CM | POA: Diagnosis not present

## 2019-11-03 DIAGNOSIS — Z3A37 37 weeks gestation of pregnancy: Secondary | ICD-10-CM | POA: Diagnosis not present

## 2019-11-03 NOTE — Patient Instructions (Addendum)
Contraception Choices Contraception, also called birth control, refers to methods or devices that prevent pregnancy. Hormonal methods Contraceptive implant  A contraceptive implant is a thin, plastic tube that contains a hormone. It is inserted into the upper part of the arm. It can remain in place for up to 3 years. Progestin-only injections Progestin-only injections are injections of progestin, a synthetic form of the hormone progesterone. They are given every 3 months by a health care provider. Birth control pills  Birth control pills are pills that contain hormones that prevent pregnancy. They must be taken once a day, preferably at the same time each day. Birth control patch  The birth control patch contains hormones that prevent pregnancy. It is placed on the skin and must be changed once a week for three weeks and removed on the fourth week. A prescription is needed to use this method of contraception. Vaginal ring  A vaginal ring contains hormones that prevent pregnancy. It is placed in the vagina for three weeks and removed on the fourth week. After that, the process is repeated with a new ring. A prescription is needed to use this method of contraception. Emergency contraceptive Emergency contraceptives prevent pregnancy after unprotected sex. They come in pill form and can be taken up to 5 days after sex. They work best the sooner they are taken after having sex. Most emergency contraceptives are available without a prescription. This method should not be used as your only form of birth control. Barrier methods Female condom  A female condom is a thin sheath that is worn over the penis during sex. Condoms keep sperm from going inside a woman's body. They can be used with a spermicide to increase their effectiveness. They should be disposed after a single use. Female condom  A female condom is a soft, loose-fitting sheath that is put into the vagina before sex. The condom keeps sperm  from going inside a woman's body. They should be disposed after a single use. Diaphragm  A diaphragm is a soft, dome-shaped barrier. It is inserted into the vagina before sex, along with a spermicide. The diaphragm blocks sperm from entering the uterus, and the spermicide kills sperm. A diaphragm should be left in the vagina for 6-8 hours after sex and removed within 24 hours. A diaphragm is prescribed and fitted by a health care provider. A diaphragm should be replaced every 1-2 years, after giving birth, after gaining more than 15 lb (6.8 kg), and after pelvic surgery. Cervical cap  A cervical cap is a round, soft latex or plastic cup that fits over the cervix. It is inserted into the vagina before sex, along with spermicide. It blocks sperm from entering the uterus. The cap should be left in place for 6-8 hours after sex and removed within 48 hours. A cervical cap must be prescribed and fitted by a health care provider. It should be replaced every 2 years. Sponge  A sponge is a soft, circular piece of polyurethane foam with spermicide on it. The sponge helps block sperm from entering the uterus, and the spermicide kills sperm. To use it, you make it wet and then insert it into the vagina. It should be inserted before sex, left in for at least 6 hours after sex, and removed and thrown away within 30 hours. Spermicides Spermicides are chemicals that kill or block sperm from entering the cervix and uterus. They can come as a cream, jelly, suppository, foam, or tablet. A spermicide should be inserted into the  vagina with an applicator at least 10-15 minutes before sex to allow time for it to work. The process must be repeated every time you have sex. Spermicides do not require a prescription. Intrauterine contraception Intrauterine device (IUD) An IUD is a T-shaped device that is put in a woman's uterus. There are two types:  Hormone IUD.This type contains progestin, a synthetic form of the hormone  progesterone. This type can stay in place for 3-5 years.  Copper IUD.This type is wrapped in copper wire. It can stay in place for 10 years.  Permanent methods of contraception Female tubal ligation In this method, a woman's fallopian tubes are sealed, tied, or blocked during surgery to prevent eggs from traveling to the uterus. Hysteroscopic sterilization In this method, a small, flexible insert is placed into each fallopian tube. The inserts cause scar tissue to form in the fallopian tubes and block them, so sperm cannot reach an egg. The procedure takes about 3 months to be effective. Another form of birth control must be used during those 3 months. Female sterilization This is a procedure to tie off the tubes that carry sperm (vasectomy). After the procedure, the man can still ejaculate fluid (semen). Natural planning methods Natural family planning In this method, a couple does not have sex on days when the woman could become pregnant. Calendar method This means keeping track of the length of each menstrual cycle, identifying the days when pregnancy can happen, and not having sex on those days. Ovulation method In this method, a couple avoids sex during ovulation. Symptothermal method This method involves not having sex during ovulation. The woman typically checks for ovulation by watching changes in her temperature and in the consistency of cervical mucus. Post-ovulation method In this method, a couple waits to have sex until after ovulation. Summary  Contraception, also called birth control, means methods or devices that prevent pregnancy.  Hormonal methods of contraception include implants, injections, pills, patches, vaginal rings, and emergency contraceptives.  Barrier methods of contraception can include female condoms, female condoms, diaphragms, cervical caps, sponges, and spermicides.  There are two types of IUDs (intrauterine devices). An IUD can be put in a woman's uterus to  prevent pregnancy for 3-5 years.  Permanent sterilization can be done through a procedure for males, females, or both.  Natural family planning methods involve not having sex on days when the woman could become pregnant. This information is not intended to replace advice given to you by your health care provider. Make sure you discuss any questions you have with your health care provider. Document Released: 12/16/2005 Document Revised: 12/18/2017 Document Reviewed: 01/18/2017 Elsevier Patient Education  2020 [employer].  Places to have your son circumcised:                                                                      [facility]     [phone]   [phone] while you are in hospital         [facility]              [phone]   [phone] by 4 wks                      [facility]  [phone]   $269 by 7 days [facility]                    [phone]   $[phone] by 4 wks [facility]             [phone]   $225 by 2 wks    These prices sometimes change but are roughly what you can expect to pay. Please call and confirm pricing.   Circumcision is considered an elective/non-medically necessary procedure. There are many reasons parents decide to have their sons circumsized. During the first year of life circumcised males have a reduced risk of urinary tract infections but after this year the rates between circumcised males and uncircumcised males are the same.  It is safe to have your son circumcised outside of the hospital and the places above perform them regularly.   Deciding about Circumcision in Baby Boys  (Up-to-date The Basics)  What is circumcision?   Circumcision is a surgery that removes the skin that covers the tip of the penis, called the "foreskin" Circumcision is usually done when a boy is between 32 and 70 days old. In the [place], circumcision is common. In some other countries, fewer boys are circumcised. Circumcision is a common tradition in some religions.  Should I have my  baby boy circumcised?   There is no easy answer. Circumcision has some benefits. But it also has risks. After talking with your doctor, you will have to decide for yourself what is right for your family.  What are the benefits of circumcision?   Circumcised boys seem to have slightly lower rates of: ?Urinary tract infections ?Swelling of the opening at the tip of the penis Circumcised men seem to have slightly lower rates of: ?Urinary tract infections ?Swelling of the opening at the tip of the penis ?Penis cancer ?HIV and other infections that you catch during sex ?Cervical cancer in the women they have sex with Even so, in the [place], the risks of these problems are small - even in boys and men who have not been circumcised. Plus, boys and men who are not circumcised can reduce these extra risks by: ?Cleaning their penis well ?Using condoms during sex  What are the risks of circumcision?  Risks include: ?Bleeding or infection from the surgery ?Damage to or amputation of the penis ?A chance that the doctor will cut off too much or not enough of the foreskin ?A chance that sex won't feel as good later in life Only about 1 out of every 200 circumcisions leads to problems. There is also a chance that your health insurance won't pay for circumcision.  How is circumcision done in baby boys?  First, the baby gets medicine for pain relief. This might be a cream on the skin or a shot into the base of the penis. Next, the doctor cleans the baby's penis well. Then he or she uses special tools to cut off the foreskin. Finally, the doctor wraps a bandage (called gauze) around the baby's penis. If you have your baby circumcised, his doctor or nurse will give you instructions on how to care for him after the surgery. It is important that you follow those instructions carefully.

## 2019-11-03 NOTE — Progress Notes (Deleted)
   PRENATAL VISIT NOTE  Subjective:  Alexandra Marshall is a 28 y.o. [id number] at [redacted]w[redacted]d who presents today for routine prenatal care.  She is currently being monitored for supervision of a {Blank single:19197::"high-risk","low-risk"} pregnancy with problems as listed below.  Patient has no pregnancy related concerns and endorses fetal movement.  She denies vaginal concerns including discharge, bleeding, leaking, itching, and burning.   Patient Active Problem List   Diagnosis Date Noted  . Gonorrhea affecting pregnancy 08/05/2019  . Trichomonal vaginitis in pregnancy 08/05/2019  . Patient underweight 08/05/2019  . High risk sexual behavior 08/05/2019  . 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 Choroid plexus cyst 08/05/2019  . Supervision of other normal pregnancy, antepartum 05/11/2019    The following portions of the patient's history were reviewed and updated as appropriate: allergies, current medications, past family history, past medical history, past social history, past surgical history and problem list. Problem list updated.  Objective:   Vitals:   11/03/19 1038  BP: 116/73  Pulse: (!) 102  Temp: 98.5 F (36.9 C)  Weight: 122 lb 6.4 oz (55.5 kg)    Fetal Status: Fetal Heart Rate (bpm): 152   Movement: Present     General:  Alert, oriented and cooperative. Patient is in no acute distress.  Skin: Skin is warm and dry.   Cardiovascular: Regular rate and rhythm.  Respiratory: Normal respiratory effort. CTA-Bilaterally  Abdomen: Soft, gravid, appropriate for gestational age.  Pelvic: {Blank single:19197::"Cervical exam performed","Cervical exam deferred"}        Extremities: Normal range of motion.  Edema: None  Mental Status: Normal mood and affect. Normal behavior. Normal judgment and thought content.   Assessment and Plan:  Pregnancy: [id number] at [redacted]w[redacted]d  1. Supervision of other normal pregnancy, antepartum ***   {Blank  single:19197::"Term","Preterm"} labor symptoms and general obstetric precautions including but not limited to vaginal bleeding, contractions, leaking of fluid and fetal movement were reviewed with the patient.  Please refer to After Visit Summary for other counseling recommendations.  No follow-ups on file.  Future Appointments  Date Time Provider [place]  11/03/2019 11:10 AM Gavin Pound, CNM [facility] None    Alexandra Marshall, CNM 11/03/2019, 11:01 AM

## 2019-11-03 NOTE — Progress Notes (Signed)
   PRENATAL VISIT NOTE  Subjective:  Alexandra Marshall is a 28 y.o. [id number] at [redacted]w[redacted]d who presents today for routine prenatal care.  She is currently being monitored for supervision of a low-risk pregnancy with problems as listed below.  Patient has no pregnancy related concerns and endorses fetal movement and occasional BH contractions.  She denies vaginal concerns including discharge, bleeding, leaking, itching, and burning. Patient reports starting her Valtrex at 36 weeks.    Patient Active Problem List   Diagnosis Date Noted  . Gonorrhea affecting pregnancy 08/05/2019  . Trichomonal vaginitis in pregnancy 08/05/2019  . Patient underweight 08/05/2019  . High risk sexual behavior 08/05/2019  . 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 Choroid plexus cyst 08/05/2019  . Supervision of other normal pregnancy, antepartum 05/11/2019    The following portions of the patient's history were reviewed and updated as appropriate: allergies, current medications, past family history, past medical history, past social history, past surgical history and problem list. Problem list updated.  Objective:   Vitals:   11/03/19 1038  BP: 116/73  Pulse: (!) 102  Temp: 98.5 F (36.9 C)  Weight: 122 lb 6.4 oz (55.5 kg)    Fetal Status: Fetal Heart Rate (bpm): 152 Fundal Height: 37 cm Movement: Present  Presentation: Vertex  General:  Alert, oriented and cooperative. Patient is in no acute distress.  Skin: Skin is warm and dry.   Cardiovascular: Regular rate and rhythm.  Respiratory: Normal respiratory effort. CTA-Bilaterally  Abdomen: Soft, gravid, appropriate for gestational age.  Pelvic: Cervical exam performed Dilation: 2 Effacement (%): Thick Station: Ballotable GBS and CV Collected  Extremities: Normal range of motion.  Edema: None  Mental Status: Normal mood and affect. Normal behavior. Normal judgment and thought content.   Assessment and Plan:  Pregnancy: [id number] at  [redacted]w[redacted]d  1. Supervision of other normal pregnancy, antepartum -Anticipatory guidance for upcoming appts.  -Labs as below. -Educated on GBS bacteria including what it is, why we test, and how and when we treat if needed. -Discussed releasing of results to mychart. -Discussed and reviewed postpartum planning including contraception, pediatricians, and infant feedings and circumcision desires/costs. *Extensive discussion regarding birth control methods in postpartum period.  *Patient desires to breastfeed and is considering pills for contraception. *Methods reviewed by tiered approach including effectiveness, risks, and benefits.  Term labor symptoms and general obstetric precautions including but not limited to vaginal bleeding, contractions, leaking of fluid and fetal movement were reviewed with the patient.  Please refer to After Visit Summary for other counseling recommendations.  Return in about 2 weeks (around 11/17/2019) for LR-ROB via Virtual Visit.  No future appointments.  Maryann Conners, CNM 11/03/2019, 11:24 AM

## 2019-11-05 ENCOUNTER — Other Ambulatory Visit (HOSPITAL_COMMUNITY): Payer: Self-pay

## 2019-11-05 DIAGNOSIS — B373 Candidiasis of vulva and vagina: Secondary | ICD-10-CM

## 2019-11-05 DIAGNOSIS — B9689 Other specified bacterial agents as the cause of diseases classified elsewhere: Secondary | ICD-10-CM

## 2019-11-05 DIAGNOSIS — B3731 Acute candidiasis of vulva and vagina: Secondary | ICD-10-CM

## 2019-11-05 LAB — CERVICOVAGINAL ANCILLARY ONLY
Bacterial Vaginitis (gardnerella): POSITIVE — AB
Candida Glabrata: NEGATIVE
Candida Vaginitis: POSITIVE — AB
Chlamydia: NEGATIVE
Comment: NEGATIVE
Comment: NEGATIVE
Comment: NEGATIVE
Comment: NEGATIVE
Comment: NEGATIVE
Comment: NORMAL
Neisseria Gonorrhea: NEGATIVE
Trichomonas: NEGATIVE

## 2019-11-05 MED ORDER — METRONIDAZOLE 500 MG PO TABS: 500.0000 mg | ORAL_TABLET | Freq: Two times a day (BID) | ORAL | 0 refills | Status: AC

## 2019-11-05 MED ORDER — TERCONAZOLE 0.4 % VA CREA: 1.0000 | TOPICAL_CREAM | Freq: Every day | VAGINAL | 0 refills | Status: AC

## 2019-11-06 LAB — CULTURE, BETA STREP (GROUP B ONLY): Strep Gp B Culture: POSITIVE — AB

## 2019-11-17 ENCOUNTER — Telehealth (INDEPENDENT_AMBULATORY_CARE_PROVIDER_SITE_OTHER): Payer: Medicaid Other

## 2019-11-17 VITALS — Wt 122.3 lb

## 2019-11-17 DIAGNOSIS — B951 Streptococcus, group B, as the cause of diseases classified elsewhere: Secondary | ICD-10-CM | POA: Insufficient documentation

## 2019-11-17 DIAGNOSIS — Z3A39 39 weeks gestation of pregnancy: Secondary | ICD-10-CM

## 2019-11-17 DIAGNOSIS — Z3480 Encounter for supervision of other normal pregnancy, unspecified trimester: Secondary | ICD-10-CM

## 2019-11-17 DIAGNOSIS — A6009 Herpesviral infection of other urogenital tract: Secondary | ICD-10-CM

## 2019-11-17 DIAGNOSIS — O98312 Other infections with a predominantly sexual mode of transmission complicating pregnancy, second trimester: Secondary | ICD-10-CM

## 2019-11-17 NOTE — Progress Notes (Signed)
I connected with@ on 11/17/19 at 10:50 AM EST by: Mychart and verified that I am speaking with the correct person using two identifiers.  Patient is located at home and provider is located at [place] .     The purpose of this virtual visit is to provide medical care while limiting exposure to the novel coronavirus. I discussed the limitations, risks, security and privacy concerns of performing an evaluation and management service by webvisit and the availability of in person appointments. I also discussed with the patient that there may be a patient responsible charge related to this service. By engaging in this virtual visit, you consent to the provision of healthcare.  Additionally, you authorize for your insurance to be billed for the services provided during this visit.  The patient expressed understanding and agreed to proceed.  The following staff members participated in the virtual visit:  J.Tennis Mckinnon, CNM    PRENATAL VISIT NOTE  Subjective:  Alexandra Marshall is a 28 y.o. [id number] at [redacted]w[redacted]d  for phone visit for ongoing prenatal care.  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  Patient reports no complaints.  She states she completed the medication for her yeast and BV infection and has had no vaginal symptoms.  Patient reports that she continues to take her valtrex.  She reports that she is considering Nexplanon for PP BCM, but does not desire inpatient placement.  Patient goes on to state that she is considering delivering at [facility] since she has been staying in that area.   Contractions: Irregular. Vag. Bleeding: None.  Movement: Present. Denies leaking of fluid.   The following  portions of the patient's history were reviewed and updated as appropriate: allergies, current medications, past family history, past medical history, past social history, past surgical history and problem list.   Objective:   Vitals:   11/17/19 1046  Weight: 122 lb 4.8 oz (55.5 kg)   Self-Obtained  Fetal Status:     Movement: Present     Assessment and Plan:  Pregnancy: [id number] at [redacted]w[redacted]d  1. Supervision of other normal pregnancy, antepartum -Anticipatory guidance for upcoming appts. -Informed okay for next visit to be virtual, but if over 40 weeks BPP or NST is needed. *BPP ordered as no availability after the 24th. -Discussed that all records should be accessible at [place] if delivering at this hospital. -Encouraged to inform office if she delivers prior to next scheduled appt.  2. 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  3. Group beta Strep positive -Reviewed results and need for prophylaxis while in labor.    Term labor symptoms and general obstetric precautions including but not limited to vaginal bleeding, contractions, leaking of fluid and fetal movement were reviewed in detail with the patient.  Return in about 1 week (around 11/24/2019) for LR-ROB  via Virtual Visit.  No future appointments.   Time spent on virtual visit: 9 minutes  Maryann Conners, CNM

## 2019-11-20 ENCOUNTER — Other Ambulatory Visit (HOSPITAL_COMMUNITY): Payer: Self-pay

## 2019-11-20 DIAGNOSIS — Z3480 Encounter for supervision of other normal pregnancy, unspecified trimester: Secondary | ICD-10-CM

## 2019-11-23 ENCOUNTER — Telehealth: Payer: Medicaid Other

## 2019-11-24 ENCOUNTER — Other Ambulatory Visit: Payer: Medicaid Other

## 2019-12-15 IMAGING — US OBSTETRIC <14 WK ULTRASOUND
1 series · 15 of 28 positions shown · non-contrast
Comparison: None.

CLINICAL DATA: Unsure of LMP

EXAM:
OBSTETRIC <14 WK ULTRASOUND
TECHNIQUE: Transabdominal ultrasound was performed for evaluation of the
gestation as well as the maternal uterus and adnexal regions.

[Series 1: obstetric <14 wk ultrasound · 15 of 49 slices shown]
[im 1/49]
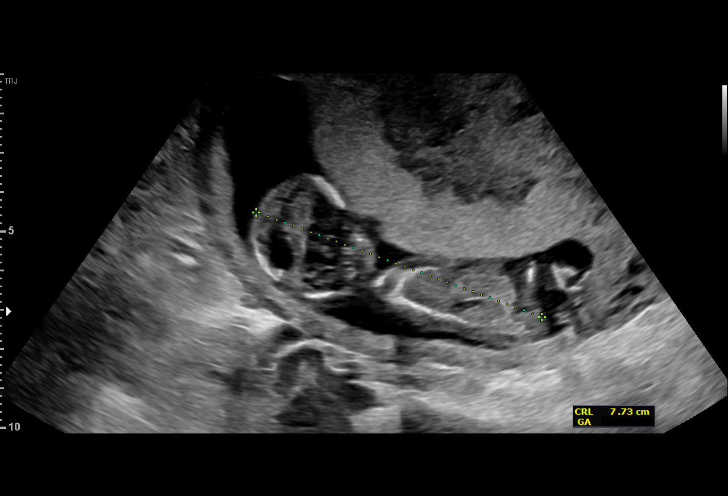
[im 4/49]
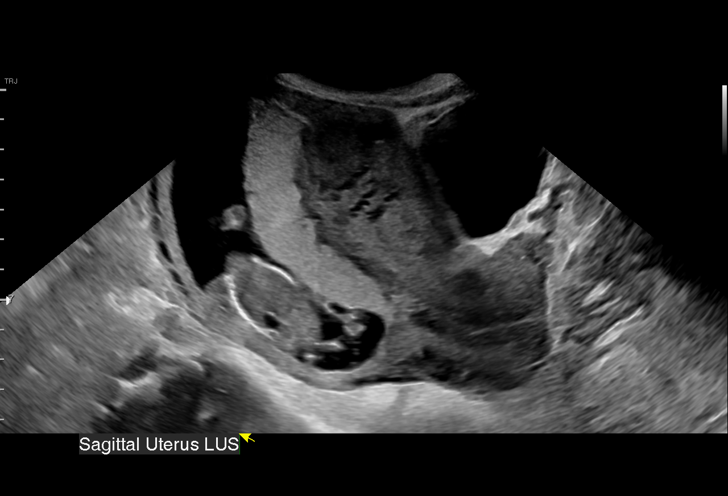
[im 8/49]
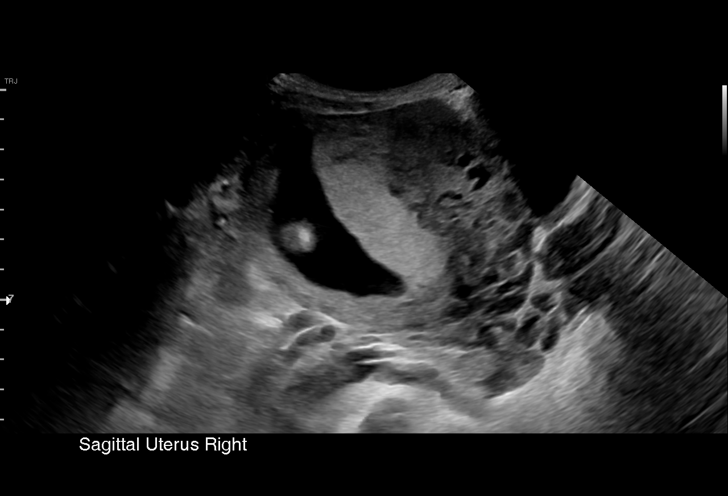
[im 11/49]
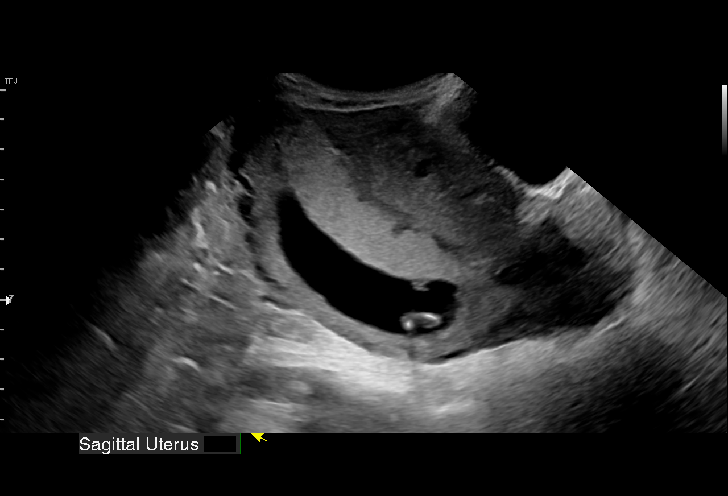
[im 15/49]
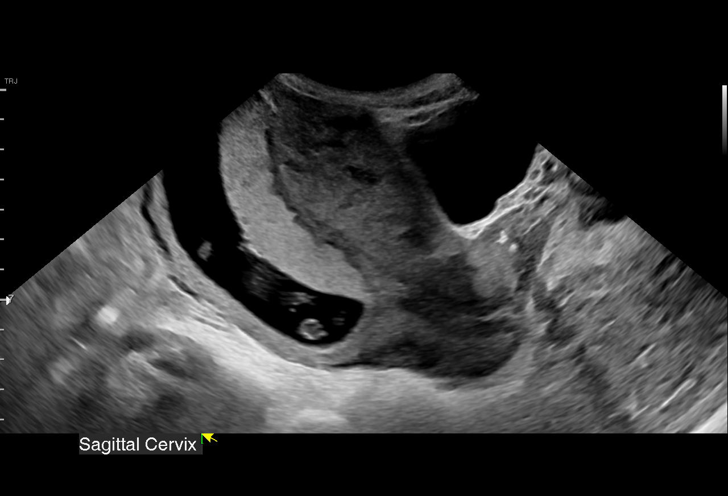
[im 18/49]
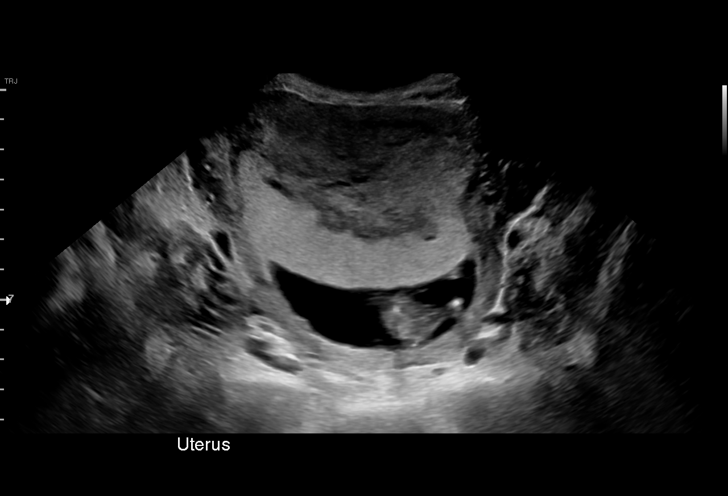
[im 22/49]
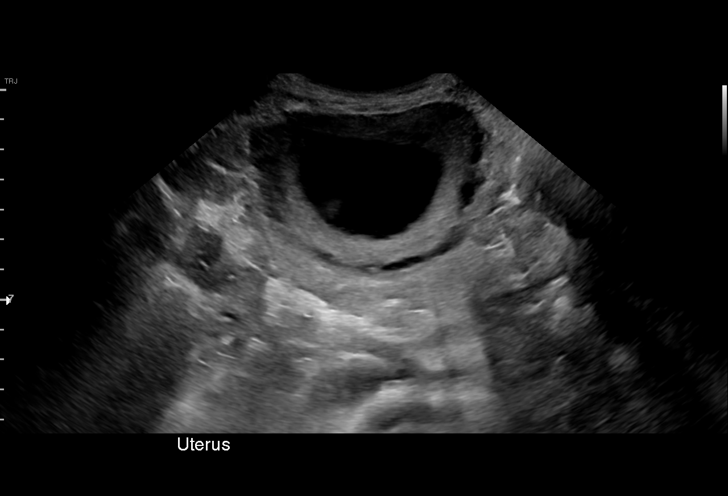
[im 25/49]
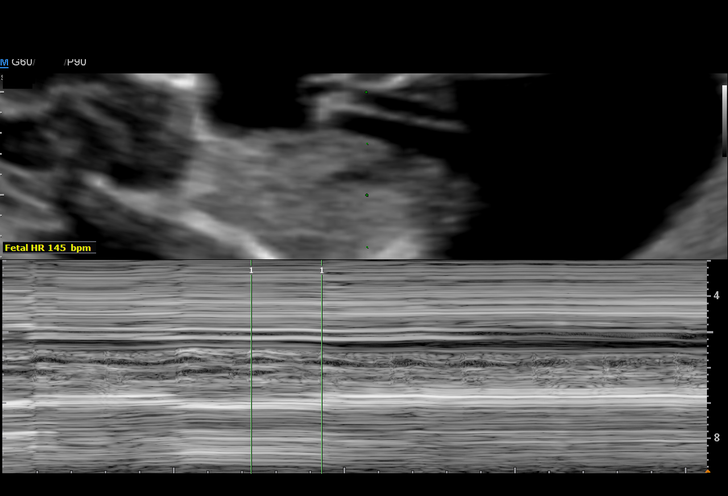
[im 27/49]
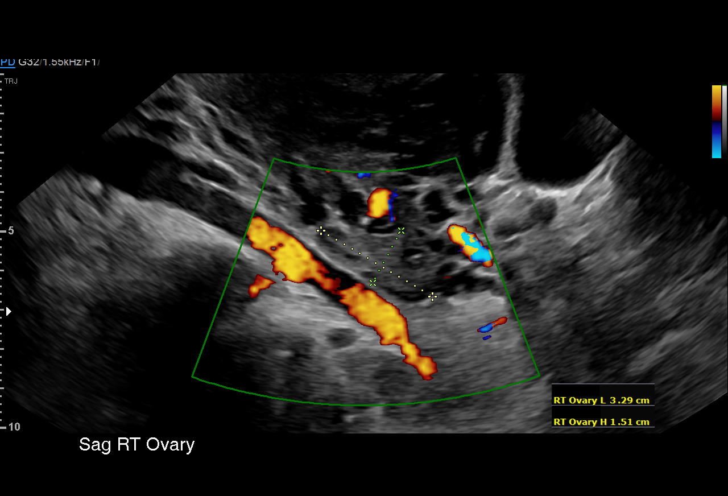
[im 31/49]
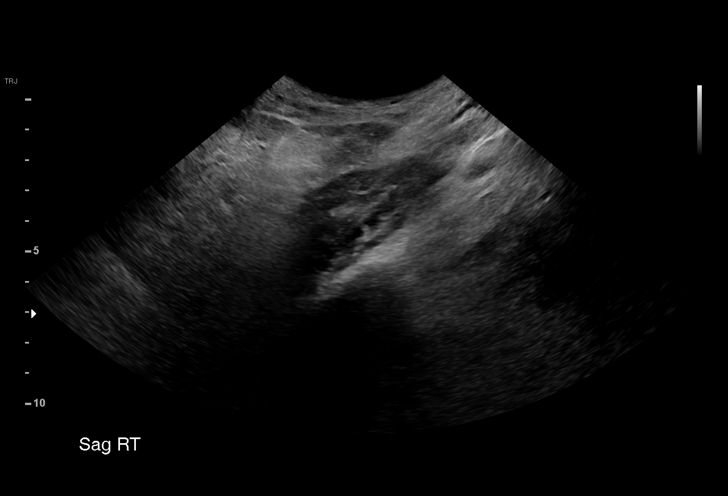
[im 34/49]
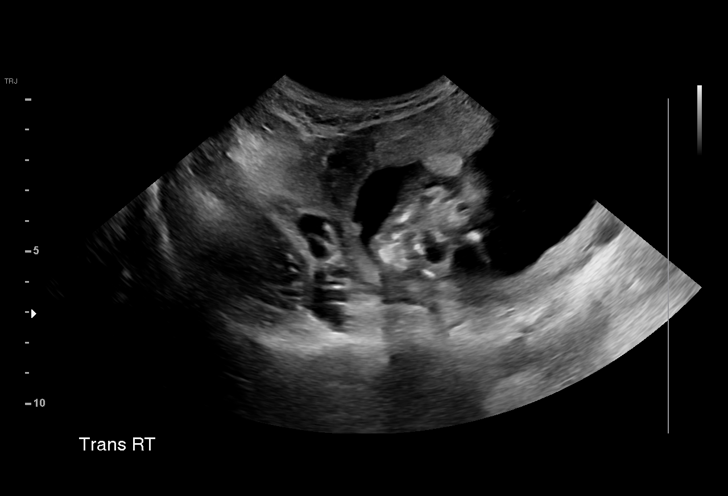
[im 38/49]
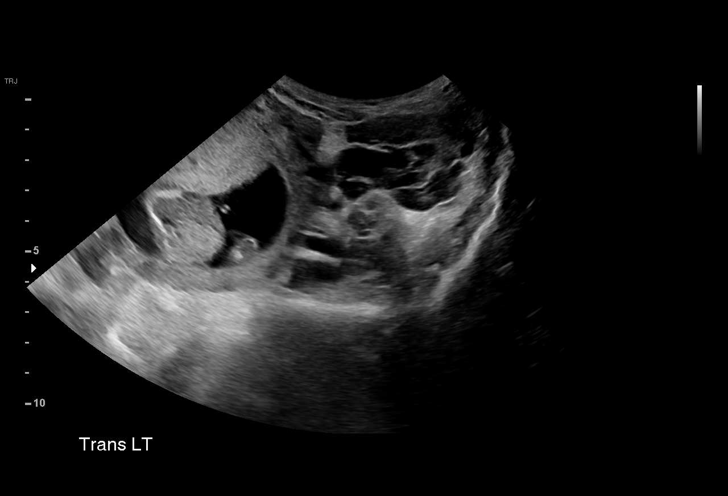
[im 41/49]
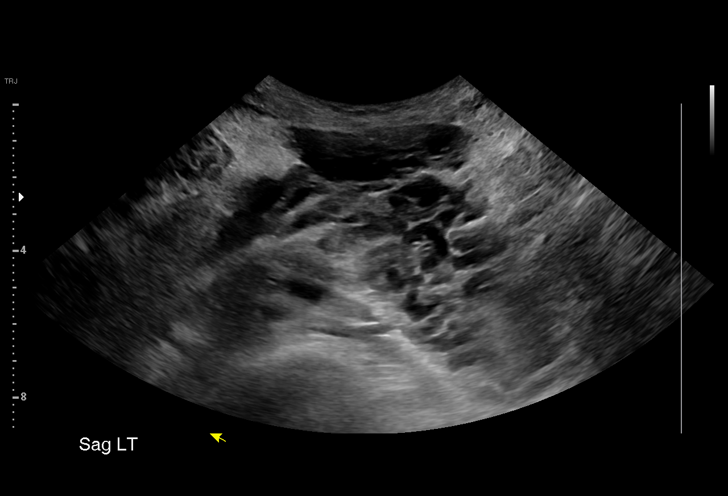
[im 45/49]
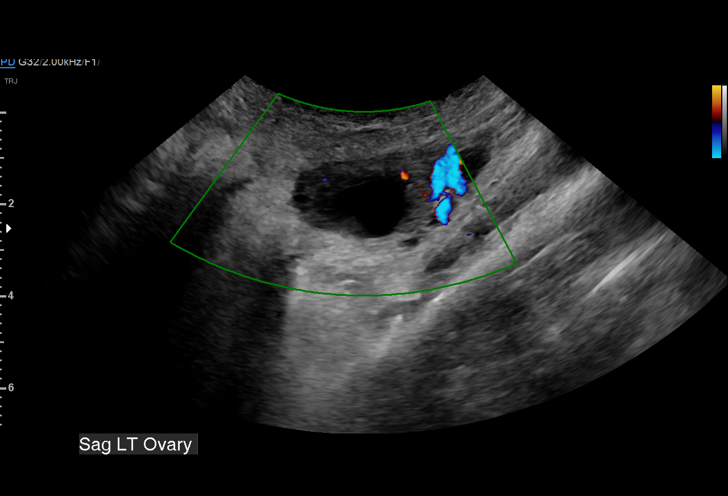
[im 49/49]
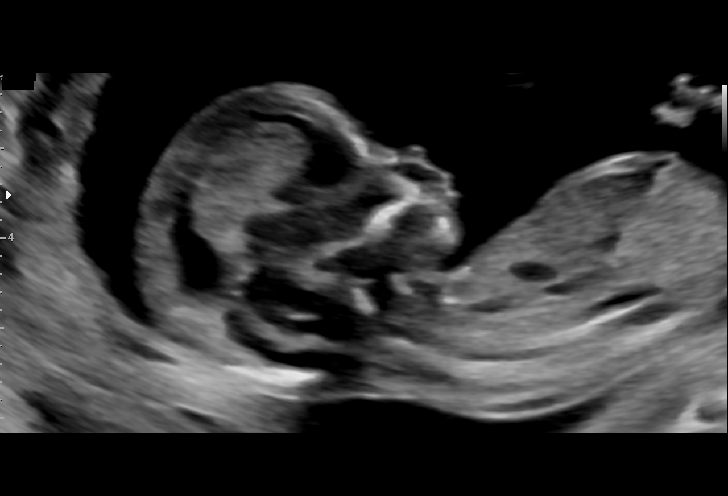

[15 of 28 positions shown; findings below may reference images not displayed]

FINDINGS: Intrauterine gestational sac: Simple

Yolk sac:  Not Visualized.

Embryo:  Visualized.

Cardiac Activity: Visualized.

Heart Rate: 145 bpm

CRL:   77 mm   13 w 6 d                  US EDC: 11/25/2019

Subchorionic hemorrhage:  None visualized.

Maternal uterus/adnexae: Normal appearance of both ovaries. No mass
or abnormal free fluid identified.
IMPRESSION: Single living IUP measuring 13 weeks 6 days, with US EDC of
11/25/2019.

No significant maternal uterine or adnexal abnormality identified.

## 2020-02-02 ENCOUNTER — Encounter: Payer: Self-pay | Admitting: General Practice

## 2020-02-03 ENCOUNTER — Ambulatory Visit: Payer: Medicaid Other | Admitting: Obstetrics and Gynecology

## 2020-02-24 ENCOUNTER — Encounter: Payer: Self-pay | Admitting: Obstetrics and Gynecology

## 2020-02-24 ENCOUNTER — Other Ambulatory Visit: Payer: Self-pay

## 2020-02-24 ENCOUNTER — Ambulatory Visit (INDEPENDENT_AMBULATORY_CARE_PROVIDER_SITE_OTHER): Payer: Medicaid Other | Admitting: Obstetrics and Gynecology

## 2020-02-24 VITALS — BP 116/72 | HR 81 | Temp 97.9°F | Ht 62.0 in | Wt 95.4 lb

## 2020-02-24 DIAGNOSIS — Z3009 Encounter for other general counseling and advice on contraception: Secondary | ICD-10-CM

## 2020-02-24 DIAGNOSIS — Z3202 Encounter for pregnancy test, result negative: Secondary | ICD-10-CM

## 2020-02-24 DIAGNOSIS — Z30011 Encounter for initial prescription of contraceptive pills: Secondary | ICD-10-CM

## 2020-02-24 LAB — POCT URINE PREGNANCY: Preg Test, Ur: NEGATIVE

## 2020-02-24 MED ORDER — NORETHIN-ETH ESTRAD-FE BIPHAS 1 MG-10 MCG / 10 MCG PO TABS: 1.0000 | ORAL_TABLET | Freq: Every day | ORAL | 0 refills | Status: AC

## 2020-02-24 MED ORDER — NORETHIN-ETH ESTRAD-FE BIPHAS 1 MG-10 MCG / 10 MCG PO TABS: 1.0000 | ORAL_TABLET | Freq: Every day | ORAL | 11 refills | Status: AC

## 2020-02-24 NOTE — Patient Instructions (Signed)
Oral Contraception Information Oral contraceptive pills (OCPs) are medicines taken to prevent pregnancy. OCPs are taken by mouth, and they work by:  Preventing the ovaries from releasing eggs.  Thickening mucus in the lower part of the uterus (cervix), which prevents sperm from entering the uterus.  Thinning the lining of the uterus (endometrium), which prevents a fertilized egg from attaching to the endometrium. OCPs are highly effective when taken exactly as prescribed. However, OCPs do not prevent STIs (sexually transmitted infections). Safe sex practices, such as using condoms while on an OCP, can help prevent STIs. Before starting OCPs Before you start taking OCPs, you may have a physical exam, blood test, and Pap test. However, you are not required to have a pelvic exam in order to be prescribed OCPs. Your health care provider will make sure you are a good candidate for oral contraception. OCPs are not a good option for certain women, including women who smoke and are older than 35 years, and women with a medical history of high blood pressure, deep vein thrombosis, pulmonary embolism, stroke, cardiovascular disease, or peripheral vascular disease. Discuss with your health care provider the possible side effects of the OCP you may be prescribed. When you start an OCP, be aware that it can take 2-3 months for your body to adjust to changes in hormone levels. Follow instructions from your health care provider about how to start taking your first cycle of OCPs. Depending on when you start the pill, you may need to use a backup form of birth control, such as condoms, during the first week. Make sure you know what steps to take if you ever forget to take the pill. Types of oral contraception  The most common types of birth control pills contain the hormones estrogen and progestin (synthetic progesterone) or progestin only. The combination pill This type of pill contains estrogen and progestin  hormones. Combination pills often come in packs of 21, 28, or 91 pills. For each pack, the last 7 pills may not contain hormones, which means you may stop taking the pills for 7 days. Menstrual bleeding occurs during the week that you do not take the pills or that you take the pills with no hormones in them. The minipill This type of pill contains the progestin hormone only. It comes in packs of 28 pills. All 28 pills contain the hormone. You take the pill every day. It is very important to take the pill at the same time each day. Advantages of oral contraceptive pills  Provides reliable and continuous contraception if taken as instructed.  May treat or decrease symptoms of: ? Menstrual period cramps. ? Irregular menstrual cycle or bleeding. ? Heavy menstrual flow. ? Abnormal uterine bleeding. ? Acne, depending on the type of pill. ? Polycystic ovarian syndrome. ? Endometriosis. ? Iron deficiency anemia. ? Premenstrual symptoms, including premenstrual dysphoric disorder.  May reduce the risk of endometrial and ovarian cancer.  Can be used as emergency contraception.  Prevents mislocated (ectopic) pregnancies and infections of the fallopian tubes. Things that can make oral contraceptive pills less effective OCPs can be less effective if:  You forget to take the pill at the same time every day. This is especially important when taking the minipill.  You have a stomach or intestinal disease that reduces your body's ability to absorb the pill.  You take OCPs with other medicines that make OCPs less effective, such as antibiotics, certain HIV medicines, and some seizure medicines.  You take expired OCPs.    You forget to restart the pill on day 7, if using the packs of 21 pills. Risks associated with oral contraceptive pills Oral contraceptive pills can sometimes cause side effects, such as:  Headache.  Depression.  Trouble sleeping.  Nausea and vomiting.  Breast  tenderness.  Irregular bleeding or spotting during the first several months.  Bloating or fluid retention.  Increase in blood pressure. Combination pills are also associated with a small increase in the risk of:  Blood clots.  Heart attack.  Stroke. Summary  Oral contraceptive pills are medicines taken by mouth to prevent pregnancy. They are highly effective when taken exactly as prescribed.  The most common types of birth control pills contain the hormones estrogen and progestin (synthetic progesterone) or progestin only.  Before you start taking the pill, you may have a physical exam, blood test, and Pap test. Your health care provider will make sure you are a good candidate for oral contraception.  The combination pill may come in a 21-day pack, a 28-day pack, or a 91-day pack. The minipill contains the progesterone hormone only and comes in packs of 28 pills.  Oral contraceptive pills can sometimes cause side effects, such as headache, nausea, breast tenderness, or irregular bleeding. This information is not intended to replace advice given to you by your health care provider. Make sure you discuss any questions you have with your health care provider. Document Revised: 11/28/2017 Document Reviewed: 03/11/2017 Elsevier Patient Education  2020 [employer].   Oral Contraception Use Oral contraceptive pills (OCPs) are medicines that you take to prevent pregnancy. OCPs work by:  Preventing the ovaries from releasing eggs.  Thickening mucus in the lower part of the uterus (cervix), which prevents sperm from entering the uterus.  Thinning the lining of the uterus (endometrium), which prevents a fertilized egg from attaching to the endometrium. OCPs are highly effective when taken exactly as prescribed. However, OCPs do not prevent sexually transmitted infections (STIs). Safe sex practices, such as using condoms while on an OCP, can help prevent STIs. Before taking OCPs, you may  have a physical exam, blood test, and Pap test. A Pap test involves taking a sample of cells from your cervix to check for cancer. Discuss with your health care provider the possible side effects of the OCP you may be prescribed. When you start an OCP, be aware that it can take 2-3 months for your body to adjust to changes in hormone levels. How to take oral contraceptive pills Follow instructions from your health care provider about how to start taking your first cycle of OCPs. Your health care provider may recommend that you:  Start the pill on day 1 of your menstrual period. If you start at this time, you will not need any backup form of birth control (contraception), such as condoms.  Start the pill on the first Sunday after your menstrual period or on the day you get your prescription. In these cases, you will need to use backup contraception for the first week.  Start the pill at any time of your cycle. ? If you take the pill within 5 days of the start of your period, you will not need a backup form of contraception. ? If you start at any other time of your menstrual cycle, you will need to use another form of contraception for 7 days. If your OCP is the type called a minipill, it will protect you from pregnancy after taking it for 2 days (48 hours), and you  can stop using backup contraception after that time. After you have started taking OCPs:  If you forget to take 1 pill, take it as soon as you remember. Take the next pill at the regular time.  If you miss 2 or more pills, call your health care provider. Different pills have different instructions for missed doses. Use backup birth control until your next menstrual period starts.  If you use a 28-day pack that contains inactive pills and you miss 1 of the last 7 pills (pills with no hormones), throw away the rest of the non-hormone pills and start a new pill pack. No matter which day you start the OCP, you will always start a new pack on  that same day of the week. Have an extra pack of OCPs and a backup contraceptive method available in case you miss some pills or lose your OCP pack. Follow these instructions at home:  Do not use any products that contain nicotine or tobacco, such as cigarettes and e-cigarettes. If you need help quitting, ask your health care provider.  Always use a condom to protect against STIs. OCPs do not protect against STIs.  Use a calendar to mark the days of your menstrual period.  Read the information and directions that came with your OCP. Talk to your health care provider if you have questions. Contact a health care provider if:  You develop nausea and vomiting.  You have abnormal vaginal discharge or bleeding.  You develop a rash.  You miss your menstrual period. Depending on the type of OCP you are taking, this may be a sign of pregnancy. Ask your health care provider for more information.  You are losing your hair.  You need treatment for mood swings or depression.  You get dizzy when taking the OCP.  You develop acne after taking the OCP.  You become pregnant or think you may be pregnant.  You have diarrhea, constipation, and abdominal pain or cramps.  You miss 2 or more pills. Get help right away if:  You develop chest pain.  You develop shortness of breath.  You have an uncontrolled or severe headache.  You develop numbness or slurred speech.  You develop visual or speech problems.  You develop pain, redness, and swelling in your legs.  You develop weakness or numbness in your arms or legs. Summary  Oral contraceptive pills (OCPs) are medicines that you take to prevent pregnancy.  OCPs do not prevent sexually transmitted infections (STIs). Always use a condom to protect against STIs.  When you start an OCP, be aware that it can take 2-3 months for your body to adjust to changes in hormone levels.  Read all the information and directions that come with your  OCP. This information is not intended to replace advice given to you by your health care provider. Make sure you discuss any questions you have with your health care provider. Document Revised: 04/09/2019 Document Reviewed: 01/27/2017 Elsevier Patient Education  [place].

## 2020-02-24 NOTE — Progress Notes (Signed)
  GYNECOLOGY PROGRESS NOTE  History:  [name] is a 29 y.o. [id number] presents to [facility] office today for contraceptive counseling and to start OCPs. She reports no complications with her postpartum period. She reports that baby was born preterm while she was staying with family in [place], [place]. She reports  baby "Alexandra Marshall did fine for the 1st 2 wks after delivery, but started acting funny." She states that when they took him to the hospital, he was diagnosed with GBS meningitis. He has suffered "significant" brain damage. He was admitted to the hospital on 12/06/2020, placed on a ventilator. He was taken off the ventilator on 12/20/2019. He has since had a G-tube placed for feedings . He was readmitted to [facility] on 02/09/2020, a shunt was placed to drain excess fluid from brain and he was released from [facility] on 02/18/2020. She reports she is adjusting to this well now, but is still learning how to take care of a child with special needs.  She denies h/a, dizziness, shortness of breath, n/v, or fever/chills.    The following portions of the patient's history were reviewed and updated as appropriate: allergies, current medications, past family history, past medical history, past social history, past surgical history and problem list.   Review of Systems:  Pertinent items are noted in HPI.   Objective:  Physical Exam Blood pressure 116/72, pulse 81, temperature 97.9 F (36.6 C), temperature source Oral, height 5\' 2"  (1.575 m), weight 95 lb 6.4 oz (43.3 kg), last menstrual period 02/08/2020, not currently breastfeeding. VS reviewed, nursing note reviewed,  Constitutional: well developed, well nourished, no distress HEENT: normocephalic CV: normal rate Pulm/chest wall: normal effort Breast Exam: deferred Abdomen: deferred Neuro: alert and oriented x 3 Skin: warm, dry Psych: affect normal Pelvic exam: Deferred  Assessment & Plan:  1. Encounter for  counseling regarding contraception - POCT urine pregnancy - Discussed starting Sunday after next period. Advised to use condoms concurrently with OCPs for 1 month. - Sample pack x 1 Norethindrone-Ethinyl Estradiol-Fe Biphas (LO LOESTRIN FE) 1 MG-10 MCG / 10 MCG tablet; Take 1 tablet by mouth daily.  Dispense: 1 Package; Refill: 0 - Offered appt with LCSW on [facility] staff PRN, just message or call the office to get scheduled.  2. Encounter for initial prescription of contraceptive pills - Rx for Norethindrone-Ethinyl Estradiol-Fe Biphas (LO LOESTRIN FE) 1 MG-10 MCG / 10 MCG tablet; Take 1 tablet by mouth daily.  Dispense: 1 Package; Refill: 11  There was 5 minutes of chart review time spent prior to this encounter. Time spent in counseling and planning 10 minutes. Total time spent - 15 minutes   [facility], Raelyn Mora 02/24/2020

## 2020-03-16 IMAGING — US US MFM OB FOLLOW UP
1 series · 14 of 28 positions shown · non-contrast
Comparison: none

[Series 1: us mfm ob follow up · 43 acquisitions, 14 frames shown]
[im 2/43]
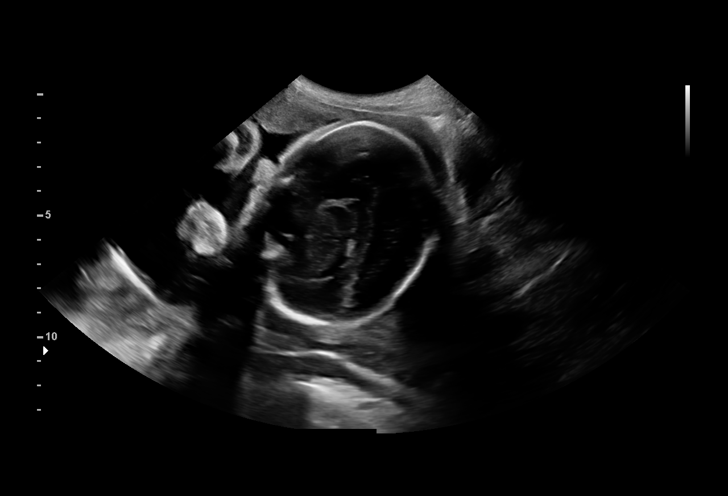
[im 5/43]
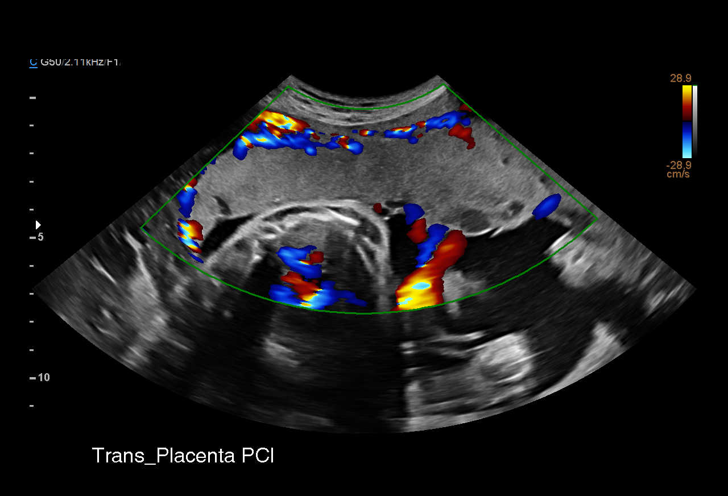
[im 8/43]
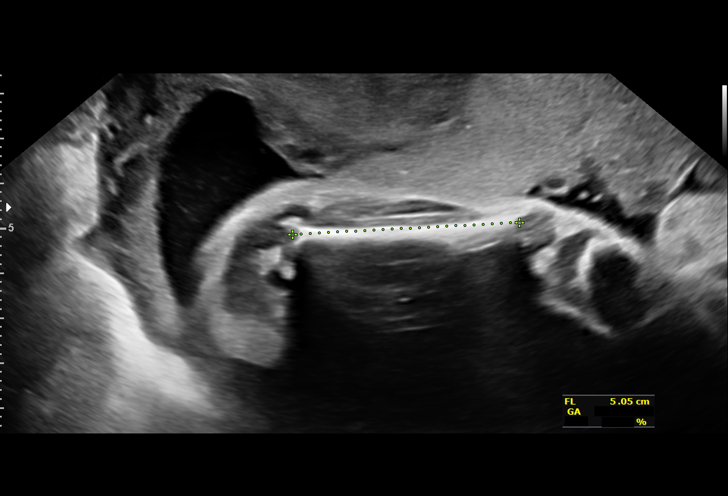
[im 11/43]
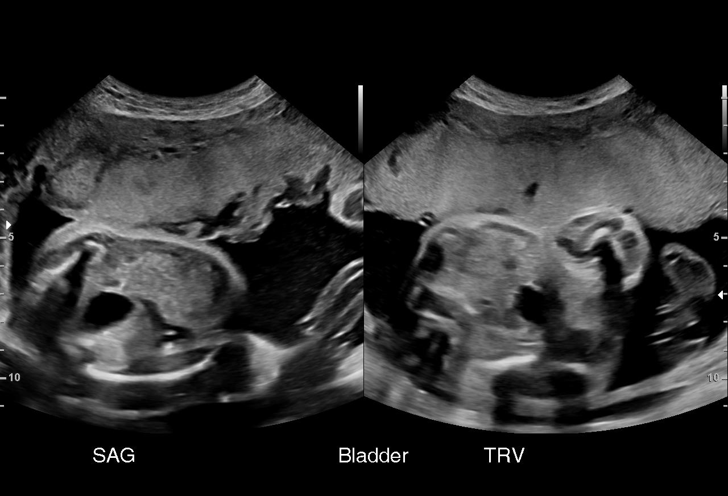
[im 15/43]
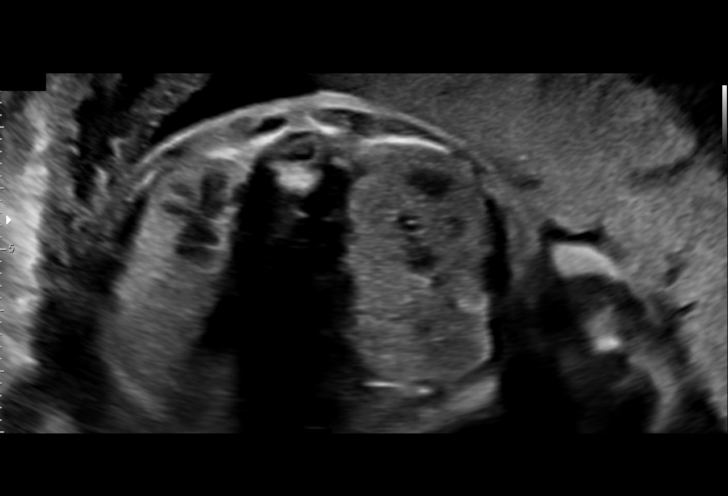
[im 18/43]
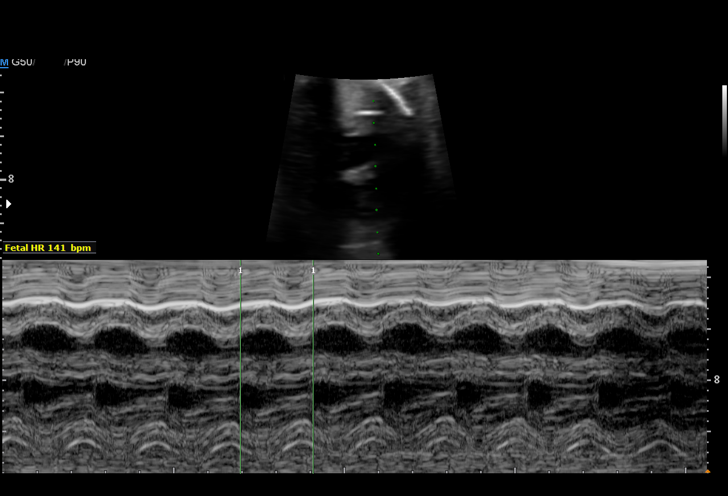
[im 21/43]
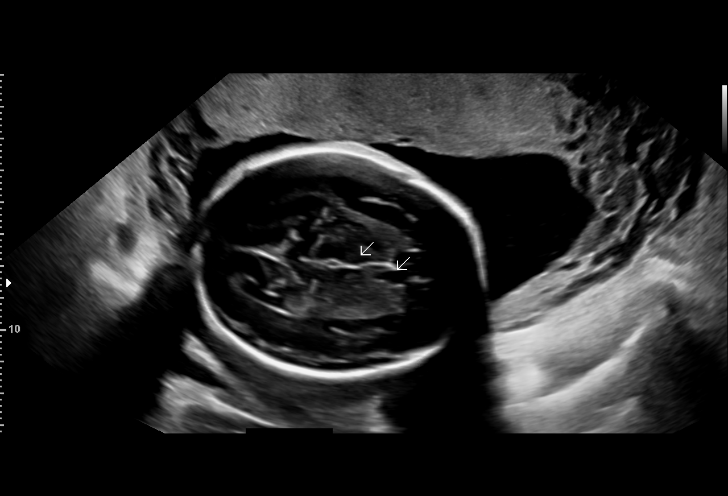
[im 24/43]
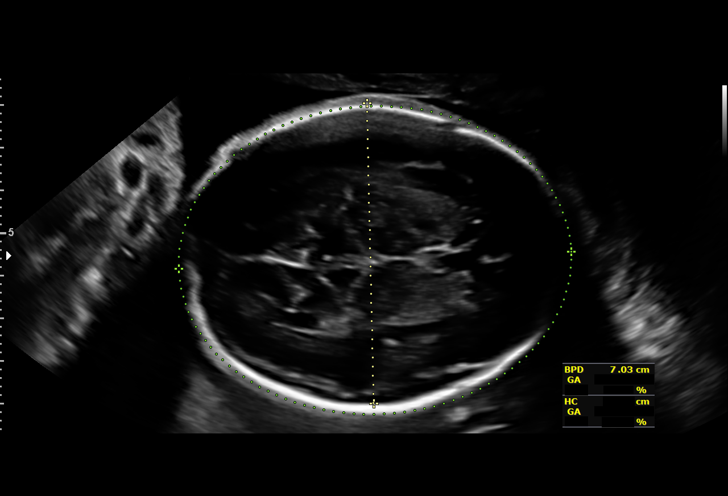
[im 27/43]
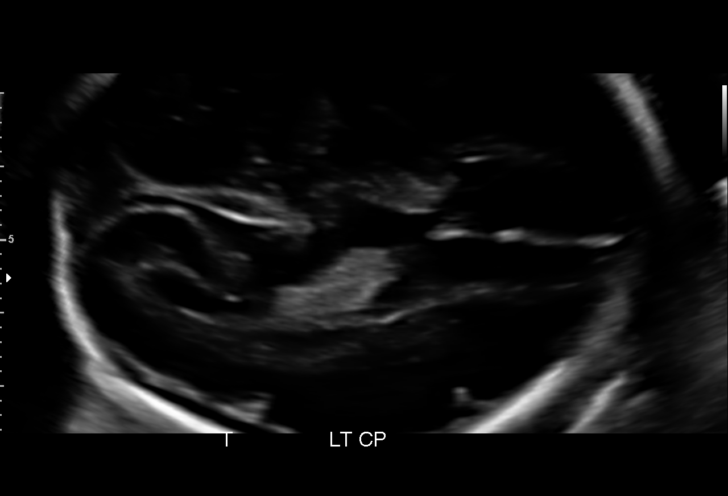
[im 30/43]
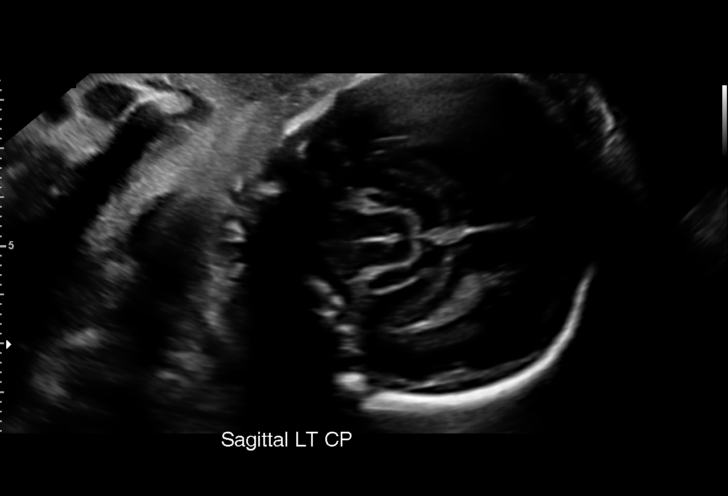
[im 33/43]
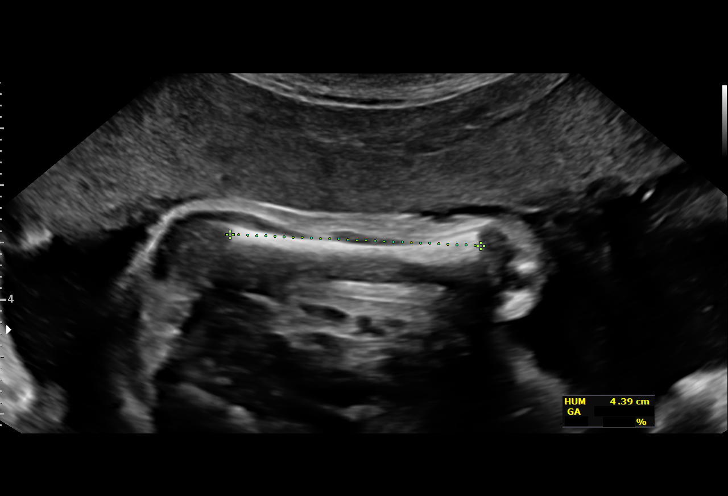
[im 36/43]
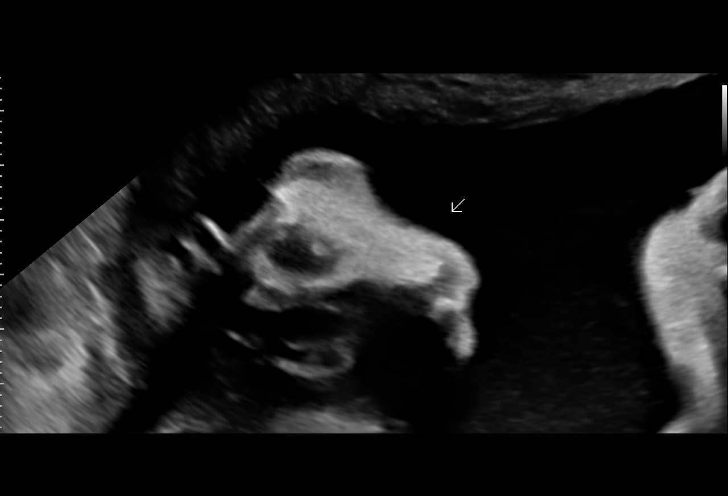
[im 39/43]
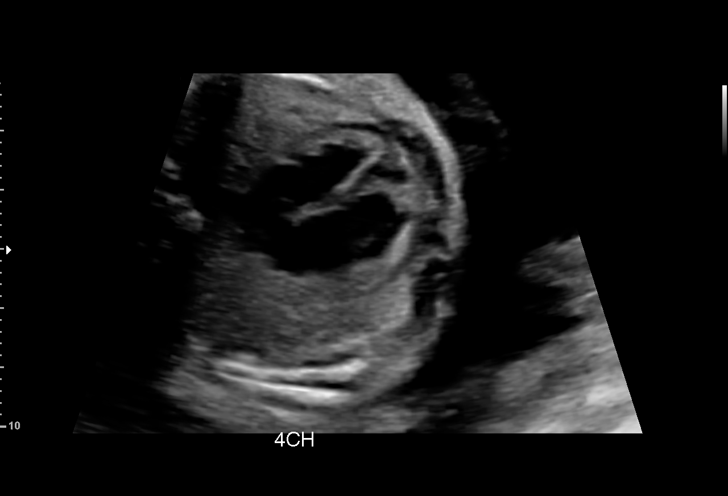
[im 43/43]
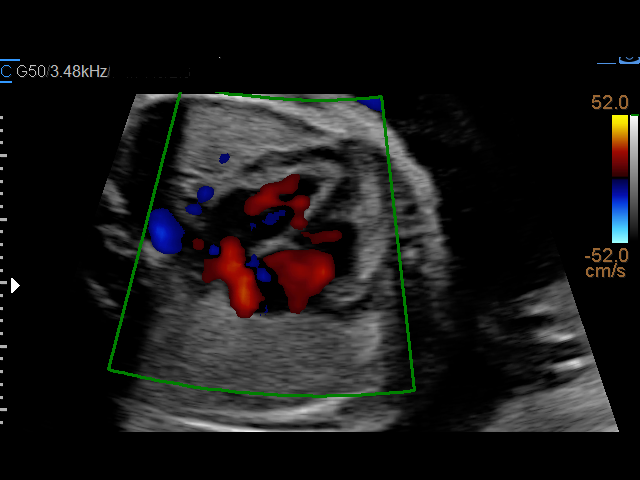

[14 of 28 positions shown; findings below may reference images not displayed]

----------------------------------------------------------------------

 ----------------------------------------------------------------------
Indications

  Encounter for other antenatal screening
  follow-up
  Fetal abnormality - other known or
  suspected ([facility])
  27 weeks gestation of pregnancy
 ----------------------------------------------------------------------
Vital Signs

 BMI:
Fetal Evaluation

 Num Of Fetuses:         1
 Fetal Heart Rate(bpm):  141
 Cardiac Activity:       Observed
 Presentation:           Cephalic
 Placenta:               Anterior
 P. Cord Insertion:      Visualized

 Amniotic Fluid
 AFI FV:      Within normal limits

                             Largest Pocket(cm)

Biometry

 BPD:      70.1  mm     G. Age:  28w 1d         68  %    CI:         73.4   %    70 - 86
                                                         FL/HC:      19.2   %    18.6 -
 HC:       260   mm     G. Age:  28w 2d         54  %    HC/AC:      1.17        1.05 -
 AC:      222.4  mm     G. Age:  26w 5d         24  %    FL/BPD:     71.2   %    71 - 87
 FL:       49.9  mm     G. Age:  26w 6d         23  %    FL/AC:      22.4   %    20 - 24
 HUM:      44.3  mm     G. Age:  26w 2d         24  %

 LV:        2.9  mm

 Est. FW:    9993  gm      2 lb 4 oz     26  %
OB History

 Gravidity:    5         Term:   3         SAB:   1
 Living:       3
Gestational Age

 LMP:           27w 2d        Date:  02/16/19                 EDD:   11/23/19
 U/S Today:     27w 4d                                        EDD:   11/21/19
 Best:          27w 2d     Det. By:  LMP  (02/16/19)          EDD:   11/23/19
Anatomy

 Cranium:               Appears normal         Diaphragm:              Appears normal
 Cavum:                 Appears normal         Stomach:                Appears normal, left
                                                                       sided
 Ventricles:            Appears normal         Abdomen:                Appears normal
 Choroid Plexus:        Appears normal         Kidneys:                Appear normal
 Thoracic:              Appears normal         Bladder:                Appears normal

 Other:  Male gender. Technically difficult due to fetal position.
Impression

 Amniotic fluid is normal and good fetal activity is seen. Fetal
 growth is appropriate for gestational age. Choroid plexus cyst
 has resolved. We reassured the patient of the findings.
Recommendations

 Follow-up scans as clinically indicated.
                 Harley, Raggio

## 2020-03-21 ENCOUNTER — Telehealth: Payer: Self-pay | Admitting: General Practice

## 2020-03-21 NOTE — Telephone Encounter (Signed)
Patient notified of Annual with pap and follow up for [facility] pills that was given at last visit on 05/25/2020 at 21:10pm.  Pt verbalized understanding.

## 2020-05-25 ENCOUNTER — Ambulatory Visit: Payer: Medicaid Other | Admitting: Obstetrics and Gynecology

## 2020-06-01 ENCOUNTER — Ambulatory Visit: Payer: Medicaid Other | Admitting: Obstetrics and Gynecology

## 2020-09-12 ENCOUNTER — Other Ambulatory Visit: Payer: Self-pay | Admitting: Critical Care Medicine

## 2020-09-12 ENCOUNTER — Other Ambulatory Visit: Payer: Medicaid Other

## 2020-09-12 DIAGNOSIS — Z20822 Contact with and (suspected) exposure to covid-19: Secondary | ICD-10-CM

## 2020-09-14 LAB — SARS-COV-2, NAA 2 DAY TAT

## 2020-09-14 LAB — NOVEL CORONAVIRUS, NAA: SARS-CoV-2, NAA: NOT DETECTED

## 2020-10-10 ENCOUNTER — Ambulatory Visit (INDEPENDENT_AMBULATORY_CARE_PROVIDER_SITE_OTHER): Payer: Medicaid Other

## 2020-10-10 DIAGNOSIS — Z23 Encounter for immunization: Secondary | ICD-10-CM

## 2020-10-10 NOTE — Progress Notes (Signed)
   Covid-19 Vaccination Clinic  Name:  Alexandra Marshall    MRN: [id number] DOB: May 21, 1991  10/10/2020  [name] was observed post Covid-19 immunization for 15 minutes without incident. She was provided with Vaccine Information Sheet and instruction to access the V-Safe system.   [name] was instructed to call 911 with any severe reactions post vaccine: Marland Kitchen Difficulty breathing  . Swelling of face and throat  . A fast heartbeat  . A bad rash all over body  . Dizziness and weakness   Immunizations Administered    Name Date Dose VIS Date Route   Pfizer COVID-19 Vaccine 10/10/2020  3:52 PM 0.3 mL 02/23/2019 Intramuscular   Manufacturer: [employer], [employer]   Lot: [id number]   NDC: [id number]

## 2020-10-31 ENCOUNTER — Ambulatory Visit (INDEPENDENT_AMBULATORY_CARE_PROVIDER_SITE_OTHER): Payer: Medicaid Other

## 2020-10-31 ENCOUNTER — Other Ambulatory Visit: Payer: Self-pay

## 2020-10-31 DIAGNOSIS — Z23 Encounter for immunization: Secondary | ICD-10-CM

## 2021-03-01 ENCOUNTER — Ambulatory Visit: Payer: Medicaid Other | Admitting: Obstetrics and Gynecology

## 2021-03-02 ENCOUNTER — Ambulatory Visit: Payer: Medicaid Other | Admitting: Advanced Practice Midwife

## 2021-04-26 ENCOUNTER — Ambulatory Visit: Payer: Medicaid Other | Admitting: Obstetrics and Gynecology

## 2021-08-30 ENCOUNTER — Ambulatory Visit: Payer: Medicaid Other | Admitting: Obstetrics and Gynecology
